# Patient Record
Sex: Male | Born: 1945 | Race: Black or African American | Hispanic: No | Marital: Single | Smoking: Current every day smoker
Health system: Southern US, Community
[De-identification: ages and names within clinical notes are randomized; demographics above are authoritative.]

## PROBLEM LIST (undated history)

## (undated) DIAGNOSIS — E119 Type 2 diabetes mellitus without complications: Secondary | ICD-10-CM

## (undated) DIAGNOSIS — R4586 Emotional lability: Secondary | ICD-10-CM

## (undated) DIAGNOSIS — I10 Essential (primary) hypertension: Secondary | ICD-10-CM

## (undated) DIAGNOSIS — K859 Acute pancreatitis without necrosis or infection, unspecified: Secondary | ICD-10-CM

---

## 2015-04-09 ENCOUNTER — Emergency Department: Payer: Non-veteran care

## 2015-04-09 ENCOUNTER — Encounter: Payer: Self-pay | Admitting: Emergency Medicine

## 2015-04-09 ENCOUNTER — Emergency Department
Admission: EM | Admit: 2015-04-09 | Discharge: 2015-04-10 | Disposition: A | Discharge status: Other Acute Inpatient Hospital | Payer: Non-veteran care | Attending: Emergency Medicine | Admitting: Emergency Medicine

## 2015-04-09 DIAGNOSIS — Z880 Allergy status to penicillin: Secondary | ICD-10-CM | POA: Insufficient documentation

## 2015-04-09 DIAGNOSIS — E119 Type 2 diabetes mellitus without complications: Secondary | ICD-10-CM | POA: Insufficient documentation

## 2015-04-09 DIAGNOSIS — K8500 Idiopathic acute pancreatitis without necrosis or infection: Secondary | ICD-10-CM

## 2015-04-09 DIAGNOSIS — R109 Unspecified abdominal pain: Secondary | ICD-10-CM

## 2015-04-09 DIAGNOSIS — Z720 Tobacco use: Secondary | ICD-10-CM | POA: Insufficient documentation

## 2015-04-09 DIAGNOSIS — I10 Essential (primary) hypertension: Secondary | ICD-10-CM | POA: Insufficient documentation

## 2015-04-09 HISTORY — DX: Type 2 diabetes mellitus without complications: E11.9

## 2015-04-09 HISTORY — DX: Acute pancreatitis without necrosis or infection, unspecified: K85.90

## 2015-04-09 HISTORY — DX: Emotional lability: R45.86

## 2015-04-09 HISTORY — DX: Essential (primary) hypertension: I10

## 2015-04-09 LAB — COMPREHENSIVE METABOLIC PANEL
ALBUMIN: 4 g/dL (ref 3.5–5.0)
ALK PHOS: 63 U/L (ref 38–126)
ALT: 84 U/L — AB (ref 17–63)
AST: 73 U/L — AB (ref 15–41)
Anion gap: 10 (ref 5–15)
BILIRUBIN TOTAL: 1.8 mg/dL — AB (ref 0.3–1.2)
BUN: 11 mg/dL (ref 6–20)
CALCIUM: 8.5 mg/dL — AB (ref 8.9–10.3)
CO2: 24 mmol/L (ref 22–32)
CREATININE: 1.11 mg/dL (ref 0.61–1.24)
Chloride: 100 mmol/L — ABNORMAL LOW (ref 101–111)
GFR calc Af Amer: >60 mL/min (ref >60)
GLUCOSE: 141 mg/dL — AB (ref 65–99)
POTASSIUM: 3.5 mmol/L (ref 3.5–5.1)
Sodium: 134 mmol/L — ABNORMAL LOW (ref 135–145)
TOTAL PROTEIN: 8.2 g/dL — AB (ref 6.5–8.1)

## 2015-04-09 LAB — CBC
HEMATOCRIT: 44.8 % (ref 40.0–52.0)
Hemoglobin: 15.2 g/dL (ref 13.0–18.0)
MCH: 30.1 pg (ref 26.0–34.0)
MCHC: 33.9 g/dL (ref 32.0–36.0)
MCV: 88.8 fL (ref 80.0–100.0)
PLATELETS: 181 10*3/uL (ref 150–440)
RBC: 5.04 MIL/uL (ref 4.40–5.90)
RDW: 15 % — AB (ref 11.5–14.5)
WBC: 17.2 10*3/uL — AB (ref 3.8–10.6)

## 2015-04-09 LAB — URINALYSIS COMPLETE WITH MICROSCOPIC (ARMC ONLY)
BILIRUBIN URINE: NEGATIVE
Bacteria, UA: NONE SEEN
GLUCOSE, UA: NEGATIVE mg/dL
Hgb urine dipstick: NEGATIVE
Ketones, ur: NEGATIVE mg/dL
Leukocytes, UA: NEGATIVE
NITRITE: NEGATIVE
Protein, ur: NEGATIVE mg/dL
SPECIFIC GRAVITY, URINE: 1.012 (ref 1.005–1.030)
Squamous Epithelial / LPF: NONE SEEN
pH: 7 (ref 5.0–8.0)

## 2015-04-09 LAB — LIPASE, BLOOD: Lipase: 21 U/L — ABNORMAL LOW (ref 22–51)

## 2015-04-09 LAB — TROPONIN I: Troponin I: <0.03 ng/mL (ref <0.031)

## 2015-04-09 MED ORDER — METOCLOPRAMIDE HCL 5 MG/ML IJ SOLN
5.0000 mg | Freq: Once | INTRAMUSCULAR | Status: AC
  Administered 2015-04-09: 5 mg via INTRAVENOUS

## 2015-04-09 MED ORDER — IOHEXOL 240 MG/ML SOLN
25.0000 mL | Freq: Once | INTRAMUSCULAR | Status: AC | PRN
  Administered 2015-04-09: 25 mL via ORAL

## 2015-04-09 MED ORDER — SODIUM CHLORIDE 0.9 % IV BOLUS (SEPSIS)
1000.0000 mL | Freq: Once | INTRAVENOUS | Status: AC
  Administered 2015-04-09: 1000 mL via INTRAVENOUS

## 2015-04-09 MED ORDER — IOHEXOL 300 MG/ML  SOLN
125.0000 mL | Freq: Once | INTRAMUSCULAR | Status: AC | PRN
  Administered 2015-04-09: 125 mL via INTRAVENOUS

## 2015-04-09 MED ORDER — ONDANSETRON HCL 4 MG/2ML IJ SOLN
4.0000 mg | Freq: Once | INTRAMUSCULAR | Status: AC
  Administered 2015-04-09: 4 mg via INTRAVENOUS

## 2015-04-09 MED ORDER — HYDROMORPHONE HCL 1 MG/ML IJ SOLN
0.5000 mg | INTRAMUSCULAR | Status: AC | PRN
  Administered 2015-04-09 (×2): 0.5 mg via INTRAVENOUS

## 2015-04-09 MED ORDER — ONDANSETRON HCL 4 MG/2ML IJ SOLN
INTRAMUSCULAR | Status: AC
  Administered 2015-04-09: 4 mg via INTRAVENOUS
  Filled 2015-04-09: qty 2

## 2015-04-09 NOTE — ED Notes (Signed)
Pt to triage via w/c, holding abdomen, appears uncomfortable; pt reports x 3 days having umbilical pain; st hx of pancreatits and pain feels same; st pain accomp by N/V

## 2015-04-09 NOTE — ED Provider Notes (Signed)
[facility] Emergency Department Alan Byrd Note  ____________________________________________  Time seen: 2105 I have reviewed the triage vital signs and the nursing notes.   HISTORY  Chief Complaint Abdominal Pain     HPI Alan Byrd is a 69 y.o. male who presents to the hospital with significant abdominal pain. He points towards the lower portion of his belly, just below his umbilicus, slightly more to the left but generally diffuse. He reports this is similar to when he had pancreatitis before, however he is not pointing to the epigastric area. When he had pancreatitis in spent 40 days in the [facility] hospital.  This pain began 3 days ago.   He denies nausea. He denies any diarrhea. His companion reports she thinks his belly is a little bit distended and firm or than usual.      Past Medical History  Diagnosis Date  . Pancreatitis   . Hypertension   . Mood swings   . Diabetes mellitus without complication     There are no active problems to display for this patient.   History reviewed. No pertinent past surgical history.  No current outpatient prescriptions on file.  Allergies Penicillins  No family history on file.  Social History Social History  Substance Use Topics  . Smoking status: Current Every Day Smoker -- 1.00 packs/day    Types: Cigarettes  . Smokeless tobacco: None  . Alcohol Use: Yes     Comment: st pint vodka and 3-4 40oz beer and whisky daily; no ETOH in 2 days    Review of Systems  Constitutional: Negative for fever. ENT: Negative for sore throat. Cardiovascular: Negative for chest pain. Respiratory: Negative for shortness of breath. Gastrointestinal: Positive for abdominal pain. See history of present illness history of pancreatitis. Genitourinary: Negative for dysuria. Musculoskeletal: No myalgias or injuries. Skin: Negative for rash. Neurological: Negative for headaches   10-point ROS  otherwise negative.  ____________________________________________   PHYSICAL EXAM:  VITAL SIGNS: ED Triage Vitals  Enc Vitals Group     BP 04/09/15 2033 128/110 mmHg     Pulse Rate 04/09/15 2033 48     Resp 04/09/15 2033 20     Temp 04/09/15 2033 97.9 F (36.6 C)     Temp Source 04/09/15 2033 Oral     SpO2 04/09/15 2033 99 %     Weight 04/09/15 2033 240 lb (108.863 kg)     Height 04/09/15 2033  (1.88 m)     Head Cir --      Peak Flow --      Pain Score 04/09/15 2034 10     Pain Loc --      Pain Edu? --      Excl. in GC? --     Constitutional:  Alert and oriented. Appears uncomfortable, holding hand over his belly, and appears in mild distress. ENT   Head: Normocephalic and atraumatic.   Nose: No congestion/rhinnorhea.   Mouth/Throat: Mucous membranes are moist. Cardiovascular: Tachycardic at a rate of 110, regular rhythm, no murmur noted Respiratory:  Normal respiratory effort, no tachypnea.    Breath sounds are clear and equal bilaterally.  Gastrointestinal: Mild distention, tender diffusely, but worse in the lower abdomen.  Back: No muscle spasm, no tenderness, no CVA tenderness. Musculoskeletal: No deformity noted. Nontender with normal range of motion in all extremities.  No noted edema. Neurologic:  Normal speech and language. No gross focal neurologic deficits are appreciated.  Skin:  Skin is warm, dry.  No rash noted. Psychiatric: Mood and affect are normal. Speech and behavior are normal.  ____________________________________________    LABS (pertinent positives/negatives)  Labs Reviewed  LIPASE, BLOOD - Abnormal; Notable for the following:    Lipase 21 (*)    All other components within normal limits  COMPREHENSIVE METABOLIC PANEL - Abnormal; Notable for the following:    Sodium 134 (*)    Chloride 100 (*)    Glucose, Bld 141 (*)    Calcium 8.5 (*)    Total Protein 8.2 (*)    AST 73 (*)    ALT 84 (*)    Total Bilirubin 1.8 (*)    All  other components within normal limits  CBC - Abnormal; Notable for the following:    WBC 17.2 (*)    RDW 15.0 (*)    All other components within normal limits  URINALYSIS COMPLETEWITH MICROSCOPIC ([facility] ONLY) - Abnormal; Notable for the following:    Color, Urine YELLOW (*)    APPearance CLEAR (*)    All other components within normal limits  TROPONIN I     ____________________________________________   EKG  ED ECG REPORT I, KAMINSKI,DAVID W, the attending physician, personally viewed and interpreted this ECG.   Date: 04/09/2015  EKG Time: 2040  Rate: 1:30  Rhythm: Tachycardia with premature atrial complexes, irregular rhythm  Axis: Left axis deviation   Intervals: QTC of 500  ST&T Change: None noted   ____________________________________________    RADIOLOGY  Acute abdomen: ----------------------------------------- 9:50 PM on 04/09/2015 ----------------------------------------- I have reviewed these images to help guide the patient's immediate and current care. There is no sign of a bowel perforation or free air. There is a minor air-fluid level or 2 consistent with a possible partial bowel obstruction. We will obtain a CT scan. Official interpretation pending.   CT of abdomen and pelvis: IMPRESSION: 1. Hazy inflammatory stranding about the pancreas, suggesting acute pancreatitis. No evidence for complication. 2. Peripancreatic adenopathy measuring up to 2.1 cm, likely reactive. 3. Mild colonic diverticulosis. 4. Hepatic steatosis.   ____________________________________________   PROCEDURES  CRITICAL CARE Performed by: Darien Ramus   Total critical care time: 40 minutes due to the worrisome initial presentation, complex differential diagnosis, review of an ordering of multiple imaging modalities, and discussed with other physicians.  Critical care time was exclusive of separately billable procedures and treating other patients.  Critical care was  necessary to treat or prevent imminent or life-threatening deterioration.  Critical care was time spent personally by me on the following activities: development of treatment plan with patient and/or surrogate as well as nursing, discussions with consultants, evaluation of patient's response to treatment, examination of patient, obtaining history from patient or surrogate, ordering and performing treatments and interventions, ordering and review of laboratory studies, ordering and review of radiographic studies, pulse oximetry and re-evaluation of patient's condition.   ____________________________________________   INITIAL IMPRESSION / ASSESSMENT AND PLAN / ED COURSE  Pertinent labs & imaging results that were available during my care of the patient were reviewed by me and considered in my medical decision making (see chart for details).  Ill-appearing 69 year old male with notable abdominal pain with some mild to moderate distention and firmness. This pattern is worrisome for a perforation. We will obtain a stat acute abdominal x-ray to assess for free air. If this is negative we will continue with a CT scan for this patient who is notably uncomfortable. Lab tests have been ordered and are pending. We will treat his pain  with IV Dilaudid and Zofran and give him 1 L of normal saline IV.  ----------------------------------------- 9:52 PM on 04/09/2015 -----------------------------------------  Plain abdominal x-ray without free air. We will obtain a CT scan to evaluate for bowel obstruction or other pathology.  ----------------------------------------- 11:02 PM on 04/09/2015 -----------------------------------------  CT scan shows hazy inflammatory stranding consistent with pancreatitis. No sign of bowel obstruction or perforation.  Reassessment of the patient at this time finds him more comfortable. I do think he should be kept in the hospital so that he can be kept on bowel rest and let  the pancreatitis resolved. He is a [place] patient and we will contact the [place] regarding transfer.  I will last Dr. Zenda Alpers to follow-up on the transfer request and seemed care of the patient at this time.  ____________________________________________   FINAL CLINICAL IMPRESSION(S) / ED DIAGNOSES  Final diagnoses:  Abdominal pain  Idiopathic acute pancreatitis      Darien Ramus, MD 04/09/15 2309

## 2015-04-10 LAB — LACTIC ACID, PLASMA: LACTIC ACID, VENOUS: 1.1 mmol/L (ref 0.5–2.0)

## 2015-04-10 MED ORDER — HYDROMORPHONE HCL 1 MG/ML IJ SOLN
INTRAMUSCULAR | Status: AC
  Administered 2015-04-10: 1 mg via INTRAVENOUS
  Filled 2015-04-10: qty 1

## 2015-04-10 MED ORDER — CIPROFLOXACIN IN D5W 400 MG/200ML IV SOLN
400.0000 mg | Freq: Once | INTRAVENOUS | Status: AC
  Administered 2015-04-10: 400 mg via INTRAVENOUS
  Filled 2015-04-10: qty 200

## 2015-04-10 MED ORDER — SODIUM CHLORIDE 0.9 % IV BOLUS (SEPSIS)
1000.0000 mL | Freq: Once | INTRAVENOUS | Status: AC
  Administered 2015-04-10: 1000 mL via INTRAVENOUS

## 2015-04-10 MED ORDER — METRONIDAZOLE IN NACL 5-0.79 MG/ML-% IV SOLN
500.0000 mg | Freq: Once | INTRAVENOUS | Status: AC
  Administered 2015-04-10: 500 mg via INTRAVENOUS
  Filled 2015-04-10: qty 100

## 2015-04-10 MED ORDER — HYDROMORPHONE HCL 1 MG/ML IJ SOLN
INTRAMUSCULAR | Status: AC
  Filled 2015-04-10: qty 1

## 2015-04-10 MED ORDER — HYDROMORPHONE HCL 1 MG/ML IJ SOLN
1.0000 mg | INTRAMUSCULAR | Status: DC | PRN
  Administered 2015-04-10: 1 mg via INTRAVENOUS

## 2015-04-10 NOTE — ED Provider Notes (Signed)
-----------------------------------------   2:26 AM on 04/10/2015 -----------------------------------------   Blood pressure 128/68, pulse 120, temperature 98.3 F (36.8 C), temperature source Oral, resp. rate 17, height  (1.88 m), weight 240 lb (108.863 kg), SpO2 94 %.  Assuming care from Dr. Carollee Massed.  In short, Alan Byrd is a 69 y.o. male with a chief complaint of Abdominal Pain .  Refer to the original H&P for additional details.  The current plan of care is to transfer to the [place].  The patient was signed out by Dr. Carollee Massed but he developed some tachycardia. He had an EKG ordered and NS 2 liters, dilaudid and antibiotics. It was discovered that the patient is allergic to PCN so his antibiotics were changed to ciprofloxacin and flagyl. After almost 2 L of NS the patient's tachycardia improved and his pain also improved. [facility]. He will be transferred. The patient's heart rate had improved to the 105-110 range prior to transfer.   ED ECG REPORT I, Rebecka Apley, the attending physician, personally viewed and interpreted this ECG.   Date: 04/10/2015  EKG Time: 0008  Rate: 141  Rhythm: sinus tachycardia, 1st degree AV block  Axis: normal  Intervals:first-degree A-V block   ST&T Change: none    Rebecka Apley, MD 04/10/15 0231

## 2015-04-15 LAB — CULTURE, BLOOD (ROUTINE X 2)
Culture: NO GROWTH
Culture: NO GROWTH
SPECIAL REQUESTS: NORMAL
Special Requests: NORMAL

## 2015-10-11 ENCOUNTER — Encounter: Payer: Self-pay | Admitting: Emergency Medicine

## 2015-10-11 ENCOUNTER — Emergency Department
Admission: EM | Admit: 2015-10-11 | Discharge: 2015-10-12 | Disposition: A | Discharge status: Other Acute Inpatient Hospital | Payer: Non-veteran care | Attending: Emergency Medicine | Admitting: Emergency Medicine

## 2015-10-11 DIAGNOSIS — F1420 Cocaine dependence, uncomplicated: Secondary | ICD-10-CM | POA: Diagnosis present

## 2015-10-11 DIAGNOSIS — F4310 Post-traumatic stress disorder, unspecified: Secondary | ICD-10-CM | POA: Diagnosis present

## 2015-10-11 DIAGNOSIS — F1410 Cocaine abuse, uncomplicated: Secondary | ICD-10-CM | POA: Insufficient documentation

## 2015-10-11 DIAGNOSIS — I10 Essential (primary) hypertension: Secondary | ICD-10-CM | POA: Insufficient documentation

## 2015-10-11 DIAGNOSIS — Z880 Allergy status to penicillin: Secondary | ICD-10-CM | POA: Insufficient documentation

## 2015-10-11 DIAGNOSIS — E119 Type 2 diabetes mellitus without complications: Secondary | ICD-10-CM

## 2015-10-11 DIAGNOSIS — Z79899 Other long term (current) drug therapy: Secondary | ICD-10-CM | POA: Insufficient documentation

## 2015-10-11 DIAGNOSIS — F1020 Alcohol dependence, uncomplicated: Secondary | ICD-10-CM | POA: Diagnosis present

## 2015-10-11 DIAGNOSIS — F1994 Other psychoactive substance use, unspecified with psychoactive substance-induced mood disorder: Secondary | ICD-10-CM | POA: Diagnosis present

## 2015-10-11 DIAGNOSIS — F329 Major depressive disorder, single episode, unspecified: Secondary | ICD-10-CM

## 2015-10-11 DIAGNOSIS — F32A Depression, unspecified: Secondary | ICD-10-CM

## 2015-10-11 DIAGNOSIS — F17210 Nicotine dependence, cigarettes, uncomplicated: Secondary | ICD-10-CM | POA: Insufficient documentation

## 2015-10-11 DIAGNOSIS — R45850 Homicidal ideations: Secondary | ICD-10-CM

## 2015-10-11 DIAGNOSIS — F10129 Alcohol abuse with intoxication, unspecified: Secondary | ICD-10-CM | POA: Insufficient documentation

## 2015-10-11 DIAGNOSIS — R45851 Suicidal ideations: Secondary | ICD-10-CM

## 2015-10-11 LAB — CBC
HEMATOCRIT: 46 % (ref 40.0–52.0)
HEMOGLOBIN: 15.3 g/dL (ref 13.0–18.0)
MCH: 31 pg (ref 26.0–34.0)
MCHC: 33.3 g/dL (ref 32.0–36.0)
MCV: 93.2 fL (ref 80.0–100.0)
Platelets: 200 10*3/uL (ref 150–440)
RBC: 4.94 MIL/uL (ref 4.40–5.90)
RDW: 14.3 % (ref 11.5–14.5)
WBC: 11.5 10*3/uL — AB (ref 3.8–10.6)

## 2015-10-11 LAB — COMPREHENSIVE METABOLIC PANEL
ALBUMIN: 4.1 g/dL (ref 3.5–5.0)
ALK PHOS: 84 U/L (ref 38–126)
ALT: 189 U/L — ABNORMAL HIGH (ref 17–63)
AST: 181 U/L — AB (ref 15–41)
Anion gap: 7 (ref 5–15)
BILIRUBIN TOTAL: 0.4 mg/dL (ref 0.3–1.2)
BUN: 7 mg/dL (ref 6–20)
CALCIUM: 8.5 mg/dL — AB (ref 8.9–10.3)
CO2: 23 mmol/L (ref 22–32)
Chloride: 108 mmol/L (ref 101–111)
Creatinine, Ser: 1.07 mg/dL (ref 0.61–1.24)
GFR calc Af Amer: >60 mL/min (ref >60)
GFR calc non Af Amer: >60 mL/min (ref >60)
GLUCOSE: 270 mg/dL — AB (ref 65–99)
Potassium: 3.7 mmol/L (ref 3.5–5.1)
Sodium: 138 mmol/L (ref 135–145)
TOTAL PROTEIN: 8.7 g/dL — AB (ref 6.5–8.1)

## 2015-10-11 LAB — URINE DRUG SCREEN, QUALITATIVE (ARMC ONLY)
AMPHETAMINES, UR SCREEN: NOT DETECTED
BENZODIAZEPINE, UR SCRN: NOT DETECTED
Barbiturates, Ur Screen: NOT DETECTED
COCAINE METABOLITE, UR ~~LOC~~: POSITIVE — AB
Cannabinoid 50 Ng, Ur ~~LOC~~: NOT DETECTED
MDMA (Ecstasy)Ur Screen: NOT DETECTED
Methadone Scn, Ur: NOT DETECTED
OPIATE, UR SCREEN: NOT DETECTED
PHENCYCLIDINE (PCP) UR S: NOT DETECTED
Tricyclic, Ur Screen: NOT DETECTED

## 2015-10-11 LAB — ETHANOL: Alcohol, Ethyl (B): 236 mg/dL — ABNORMAL HIGH (ref <5)

## 2015-10-11 LAB — SALICYLATE LEVEL: Salicylate Lvl: <4 mg/dL (ref 2.8–30.0)

## 2015-10-11 LAB — GLUCOSE, CAPILLARY: Glucose-Capillary: 188 mg/dL — ABNORMAL HIGH (ref 65–99)

## 2015-10-11 LAB — ACETAMINOPHEN LEVEL: Acetaminophen (Tylenol), Serum: <10 ug/mL — ABNORMAL LOW (ref 10–30)

## 2015-10-11 MED ORDER — TRAZODONE HCL 50 MG PO TABS
50.0000 mg | ORAL_TABLET | Freq: Every day | ORAL | Status: DC
  Administered 2015-10-11: 50 mg via ORAL
  Filled 2015-10-11: qty 1

## 2015-10-11 MED ORDER — CITALOPRAM HYDROBROMIDE 20 MG PO TABS
20.0000 mg | ORAL_TABLET | Freq: Every day | ORAL | Status: DC
  Administered 2015-10-11 – 2015-10-12 (×2): 20 mg via ORAL
  Filled 2015-10-11 (×2): qty 1

## 2015-10-11 MED ORDER — METFORMIN HCL 500 MG PO TABS
500.0000 mg | ORAL_TABLET | Freq: Two times a day (BID) | ORAL | Status: DC
  Administered 2015-10-11 – 2015-10-12 (×2): 500 mg via ORAL
  Filled 2015-10-11 (×2): qty 1

## 2015-10-11 MED ORDER — HYDROCHLOROTHIAZIDE 12.5 MG PO CAPS
12.5000 mg | ORAL_CAPSULE | Freq: Every day | ORAL | Status: DC
  Administered 2015-10-11 – 2015-10-12 (×2): 12.5 mg via ORAL
  Filled 2015-10-11 (×2): qty 1

## 2015-10-11 NOTE — ED Notes (Signed)
Patient given breakfast tray. Patient resting comfortably at this time.

## 2015-10-11 NOTE — ED Notes (Signed)
Patient ambulatory to triage with steady gait, without difficulty or distress noted; [place] [place] [facility] st SI, hx PTSD, ETOH

## 2015-10-11 NOTE — ED Notes (Signed)

## 2015-10-11 NOTE — ED Provider Notes (Signed)
[facility] Emergency Department Provider Note  ____________________________________________  Time seen: Approximately 1:44 AM  I have reviewed the triage vital signs and the nursing notes.   HISTORY  Chief Complaint Mental Health Problem    HPI Alan Byrd is a 70 y.o. male brought to the ED from home by [facility] police officers with a chief complaint of depression with suicidal ideation. Patient has a history of PTSD, states he has "women troubles". Arrives intoxicated complaining of depression and voicing suicidal thoughts without plan. Voices no medical complaints. Specifically, denies fever, chills, chest pain, shortness of breath, abdominal pain, nausea, vomiting, diarrhea. Denies homicidal ideation, AH or VH.  Past Medical History  Diagnosis Date  . Pancreatitis   . Hypertension   . Mood swings (HCC)   . Diabetes mellitus without complication (HCC)     There are no active problems to display for this patient.   History reviewed. No pertinent past surgical history.  Current Outpatient Rx  Name  Route  Sig  Dispense  Refill  . lisinopril (PRINIVIL,ZESTRIL) 5 MG tablet   Oral   Take 5 mg by mouth daily.         . mirtazapine (REMERON) 30 MG tablet   Oral   Take 30 mg by mouth at bedtime.         Marland Kitchen oxyCODONE-acetaminophen (PERCOCET/ROXICET) 5-325 MG tablet   Oral   Take 1 tablet by mouth every 4 (four) hours as needed for severe pain.         . traZODone (DESYREL) 50 MG tablet   Oral   Take 50 mg by mouth at bedtime.           Allergies Penicillins  No family history on file.  Social History Social History  Substance Use Topics  . Smoking status: Current Every Day Smoker -- 1.00 packs/day    Types: Cigarettes  . Smokeless tobacco: None  . Alcohol Use: Yes     Comment: st pint vodka and 3-4 40oz beer and whisky daily; no ETOH in 2 days    Review of Systems  Constitutional: No fever/chills. Eyes: No visual  changes. ENT: No sore throat. Cardiovascular: Denies chest pain. Respiratory: Denies shortness of breath. Gastrointestinal: No abdominal pain.  No nausea, no vomiting.  No diarrhea.  No constipation. Genitourinary: Negative for dysuria. Musculoskeletal: Negative for back pain. Skin: Negative for rash. Neurological: Negative for headaches, focal weakness or numbness. Psychiatric: Positive for depression with SI.  10-point ROS otherwise negative.  ____________________________________________   PHYSICAL EXAM:  VITAL SIGNS: ED Triage Vitals  Enc Vitals Group     BP 10/11/15 0027 158/89 mmHg     Pulse Rate 10/11/15 0027 120     Resp 10/11/15 0027 18     Temp 10/11/15 0027 97.6 F (36.4 C)     Temp Source 10/11/15 0027 Oral     SpO2 10/11/15 0027 98 %     Weight 10/11/15 0027 225 lb (102.059 kg)     Height 10/11/15 0027  (1.88 m)     Head Cir --      Peak Flow --      Pain Score --      Pain Loc --      Pain Edu? --      Excl. in GC? --     Constitutional: Alert and oriented. Well appearing and in no acute distress. Intoxicated. Eyes: Conjunctivae are normal. PERRL. EOMI. Head: Atraumatic. Nose: No congestion/rhinnorhea. Mouth/Throat: Mucous membranes  are moist.  Oropharynx non-erythematous. Neck: No stridor.   Cardiovascular: Normal rate, regular rhythm. Grossly normal heart sounds.  Good peripheral circulation. Respiratory: Normal respiratory effort.  No retractions. Lungs CTAB. Gastrointestinal: Soft and nontender. No distention. No abdominal bruits. No CVA tenderness. Musculoskeletal: No lower extremity tenderness nor edema.  No joint effusions. Neurologic:  Normal speech and language. No gross focal neurologic deficits are appreciated. No gait instability. Skin:  Skin is warm, dry and intact. No rash noted. Psychiatric: Mood and affect are normal. Speech and behavior are normal.  ____________________________________________   LABS (all labs ordered are  listed, but only abnormal results are displayed)  Labs Reviewed  COMPREHENSIVE METABOLIC PANEL - Abnormal; Notable for the following:    Glucose, Bld 270 (*)    Calcium 8.5 (*)    Total Protein 8.7 (*)    AST 181 (*)    ALT 189 (*)    All other components within normal limits  ETHANOL - Abnormal; Notable for the following:    Alcohol, Ethyl (B) 236 (*)    All other components within normal limits  ACETAMINOPHEN LEVEL - Abnormal; Notable for the following:    Acetaminophen (Tylenol), Serum <10 (*)    All other components within normal limits  CBC - Abnormal; Notable for the following:    WBC 11.5 (*)    All other components within normal limits  URINE DRUG SCREEN, QUALITATIVE ([facility] ONLY) - Abnormal; Notable for the following:    Cocaine Metabolite,Ur  POSITIVE (*)    All other components within normal limits  SALICYLATE LEVEL   ____________________________________________  EKG  None ____________________________________________  RADIOLOGY  None ____________________________________________   PROCEDURES  Procedure(s) performed: None  Critical Care performed: No  ____________________________________________   INITIAL IMPRESSION / ASSESSMENT AND PLAN / ED COURSE  Pertinent labs & imaging results that were available during my care of the patient were reviewed by me and considered in my medical decision making (see chart for details).  70 year old male with a history of PTSD [place] [facility]. Laboratory and urinalysis results remarkable for mildly elevated transaminases along with cocaine-positive metabolites and urine. Patient has no abdominal or chest pain on my exam. He is medically cleared at this time for TTS and psychiatry evaluations. Contracts for safety in the emergency department; will maintain voluntary status.  ----------------------------------------- 7:13 AM on  10/11/2015 -----------------------------------------  Patient resting in no acute distress. [facility]. ____________________________________________   FINAL CLINICAL IMPRESSION(S) / ED DIAGNOSES  Final diagnoses:  Alcohol abuse with intoxication (HCC)  Cocaine abuse  Depression      Irean HongJade J Zakkery Dorian, MD 10/11/15 [id number]
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
## 2015-10-11 NOTE — ED Notes (Addendum)
Patient assigned to appropriate care area. Patient oriented to unit/care area: Informed that, for their safety, care areas are designed for safety and monitored by security cameras at all times; and visiting hours explained to patient. Patient verbalizes understanding, and verbal contract for safety obtained.  Pt denies pain and SI. Positive for HI. "Not anyone here." Pt stated he does not feel safe going home at this time. Pt cooperative, calm. No concerns voiced. No distress noted. Maintained on 15 minute checks and observation by security camera for safety.

## 2015-10-11 NOTE — ED Notes (Signed)
Patient resting quietly in room. No noted distress or abnormal behaviors noted. Will continue 15 minute checks and observation by security camera for safety. 

## 2015-10-11 NOTE — ED Notes (Signed)
Patient resting with eyes closed. Even respirations noted. 

## 2015-10-11 NOTE — ED Notes (Signed)
Sandwich and soft drink given.  

## 2015-10-11 NOTE — Progress Notes (Addendum)
Inpatient Diabetes Program Recommendations  AACE/ADA: New Consensus Statement on Inpatient Glycemic Control (2015)  Target Ranges:  Prepandial:   less than 140 mg/dL      Peak postprandial:   less than 180 mg/dL (1-2 hours)      Critically ill patients:  140 - 180 mg/dL    Inpatient Diabetes Program Recommendations: Patient has a documented history of diabetes- consider checking an A1C and blood sugars tid and hs.  Consider ordering a carb modified/ heart healthy diet.  Susette RacerJulie Kaaren Nass, RN, BA, MHA, CDE Diabetes Coordinator Inpatient Diabetes Program  [phone] (Team Pager) [phone] [facility] Office) 10/11/2015 12:57 PM

## 2015-10-11 NOTE — ED Notes (Signed)
Pt resting in bed, watching TV. Pt remains calm. No concerns voiced. No distress noted. Maintained on 15 minute checks and observation by security camera for safety.

## 2015-10-11 NOTE — ED Provider Notes (Signed)
[facility] Emergency Department Provider Note  ____________________________________________  Time seen: Approximately 11:07 AM  I have reviewed the triage vital signs and the nursing notes.   HISTORY  Chief Complaint Mental Health Problem  HPI Alan Byrd is a 70 y.o. male who is complaining that he's had progressive worsening depression just secondary to life in general. Patient does not elaborate any further as far as the history of why he is so depressed. The patient states that he's had a plan to take pills to overdose. Patient denies any other recent illnesses, or major medical problems. Patient also denies any homicidal ideations. Patient denies any hallucinations, delusions, hearing voices, nausea, vomiting, diarrhea, urinary or respiratory symptoms. Patient denies any pain anywhere. Patient does have history of alcohol and cocaine abuse as well.   Past Medical History  Diagnosis Date  . Pancreatitis   . Hypertension   . Mood swings (HCC)   . Diabetes mellitus without complication [facility])     Patient Active Problem List   Diagnosis Date Noted  . Substance induced mood disorder (HCC) 10/11/2015  . Homicidal ideation 10/11/2015  . Suicidal ideation 10/11/2015  . Alcohol abuse 10/11/2015  . Cocaine abuse 10/11/2015  . PTSD (post-traumatic stress disorder) 10/11/2015  . Hypertension 10/11/2015  . Diabetes mellitus without complication (HCC) 10/11/2015    History reviewed. No pertinent past surgical history.  Current Outpatient Rx  Name  Route  Sig  Dispense  Refill  . lisinopril (PRINIVIL,ZESTRIL) 5 MG tablet   Oral   Take 5 mg by mouth daily.         . mirtazapine (REMERON) 30 MG tablet   Oral   Take 30 mg by mouth at bedtime.         Marland Kitchen oxyCODONE-acetaminophen (PERCOCET/ROXICET) 5-325 MG tablet   Oral   Take 1 tablet by mouth every 4 (four) hours as needed for severe pain.         . traZODone (DESYREL) 50 MG tablet   Oral    Take 50 mg by mouth at bedtime.           Allergies Penicillins  No family history on file.  Social History Social History  Substance Use Topics  . Smoking status: Current Every Day Smoker -- 1.00 packs/day    Types: Cigarettes  . Smokeless tobacco: None  . Alcohol Use: Yes     Comment: st pint vodka and 3-4 40oz beer and whisky daily; no ETOH in 2 days    Review of Systems Constitutional: No fever/chills Eyes: No visual changes. ENT: No sore throat. Cardiovascular: Denies chest pain. Respiratory: Denies shortness of breath. Gastrointestinal: No abdominal pain.  No nausea, no vomiting.  No diarrhea.  No constipation. Genitourinary: Negative for dysuria. Musculoskeletal: Negative for back pain. Skin: Negative for rash. Neurological: Negative for headaches, focal weakness or numbness. Psychological:  Depression and suicidal ideations with suicidal plan to take a bunch of pills.   10-point ROS otherwise negative.  ____________________________________________   PHYSICAL EXAM:  VITAL SIGNS: ED Triage Vitals  Enc Vitals Group     BP 10/11/15 0027 158/89 mmHg     Pulse Rate 10/11/15 0027 120     Resp 10/11/15 0027 18     Temp 10/11/15 0027 97.6 F (36.4 C)     Temp Source 10/11/15 0027 Oral     SpO2 10/11/15 0027 98 %     Weight 10/11/15 0027 225 lb (102.059 kg)     Height 10/11/15 0027 6'  2" (1.88 m)     Head Cir --      Peak Flow --      Pain Score --      Pain Loc --      Pain Edu? --      Excl. in GC? --     Constitutional: Alert and oriented. Well appearing and in no acute distress. Eyes: Conjunctivae are normal. PERRL. EOMI. Head: Atraumatic. Nose: No congestion/rhinnorhea. Mouth/Throat: Mucous membranes are moist.  Oropharynx non-erythematous. Neck: No stridor.   Cardiovascular: Normal rate, regular rhythm. Grossly normal heart sounds.  Good peripheral circulation. Respiratory: Normal respiratory effort.  No retractions. Lungs  CTAB. Gastrointestinal: Soft and nontender. No distention. No abdominal bruits. No CVA tenderness. Musculoskeletal: No lower extremity tenderness nor edema.  No joint effusions. Neurologic:  Normal speech and language. No gross focal neurologic deficits are appreciated. No gait instability. Skin:  Skin is warm, dry and intact. No rash noted. Psychiatric: Mood and affect are depressed. Speech and behavior are normal.Pt is still SI, but no HI  ____________________________________________   LABS (all labs ordered are listed, but only abnormal results are displayed)  Labs Reviewed  COMPREHENSIVE METABOLIC PANEL - Abnormal; Notable for the following:    Glucose, Bld 270 (*)    Calcium 8.5 (*)    Total Protein 8.7 (*)    AST 181 (*)    ALT 189 (*)    All other components within normal limits  ETHANOL - Abnormal; Notable for the following:    Alcohol, Ethyl (B) 236 (*)    All other components within normal limits  ACETAMINOPHEN LEVEL - Abnormal; Notable for the following:    Acetaminophen (Tylenol), Serum <10 (*)    All other components within normal limits  CBC - Abnormal; Notable for the following:    WBC 11.5 (*)    All other components within normal limits  URINE DRUG SCREEN, QUALITATIVE ([facility] ONLY) - Abnormal; Notable for the following:    Cocaine Metabolite,Ur [place] POSITIVE (*)    All other components within normal limits  GLUCOSE, CAPILLARY - Abnormal; Notable for the following:    Glucose-Capillary 188 (*)    All other components within normal limits  SALICYLATE LEVEL   ____________________________________________  EKG  none ____________________________________________  RADIOLOGY  none ____________________________________________   PROCEDURES  Procedure(s) performed: None  Critical Care performed: No  ____________________________________________   INITIAL IMPRESSION / ASSESSMENT AND PLAN / ED COURSE  Pertinent labs & imaging results that were available  during my care of the patient were reviewed by me and considered in my medical decision making (see chart for details).  Patient will be evaluated as a TTS her behavior health for further treatment and evaluation at this time. ____________________________________________   FINAL CLINICAL IMPRESSION(S) / ED DIAGNOSES  Final diagnoses:  Alcohol abuse with intoxication (HCC)  Cocaine abuse  Depression      Leona CarryLinda M Aahil Fredin, MD 10/11/15 [phone]

## 2015-10-11 NOTE — ED Notes (Signed)
Dr clapacs in with pt  

## 2015-10-11 NOTE — ED Notes (Signed)
Pt requested a 2nd blanket. Compliant with medications. No further concerns voiced. No distress noted. Maintained on 15 minute checks and observation by security camera for safety.

## 2015-10-11 NOTE — ED Notes (Signed)
Pt. Alert and oriented, warm and dry, in no distress. Pt. Denies SI, HI, AVH, and any withdraw symptoms at time of assessement. Pt. Encouraged to let nursing staff know of any concerns or needs.

## 2015-10-11 NOTE — BH Assessment (Signed)
Assessment Note  Alan Byrd is an 70 y.o. male presenting to the ED via police after getting into an altercation over drugs.  Pt states he had the police bring him to the ED instead of jail.  Pt reports getting an argument with his girlfriend and some other men over money they owed him for drugs.  He states that he wanted the police to bring him to the Ed instead of taking him to jail.  He reports making statements about "killing a motherfucker" but did not identify who he was talking about.  Pt denies SI.  His BAC was 236 and tested positive for cocaine.  Diagnosis: Alcohol intoxication  Past Medical History:  Past Medical History  Diagnosis Date  . Pancreatitis   . Hypertension   . Mood swings (HCC)   . Diabetes mellitus without complication (HCC)     History reviewed. No pertinent past surgical history.  Family History: No family history on file.  Social History:  reports that he has been smoking Cigarettes.  He has been smoking about 1.00 pack per day. He does not have any smokeless tobacco history on file. He reports that he drinks alcohol. He reports that he does not use illicit drugs.  Additional Social History:  Alcohol / Drug Use History of alcohol / drug use?: Yes Substance #1 Name of Substance 1: ETOH 1 - Age of First Use: doesn't remember 1 - Amount (size/oz): 1/5 liquor 1 - Frequency: daily 1 - Duration: all day 1 - Last Use / Amount: 10/10/2015 Substance #2 Name of Substance 2: Cocaine 2 - Age of First Use: doesn't remember 2 - Amount (size/oz): gram 2 - Frequency: "as often as I cn get it" 2 - Duration: "as often as I can get it" 2 - Last Use / Amount: 10/11/2015  CIWA: CIWA-Ar BP: (!) 158/89 mmHg Pulse Rate: (!) 120 COWS:    Allergies:  Allergies  Allergen Reactions  . Penicillins Rash    Home Medications:  (Not in a hospital admission)  OB/GYN Status:  No LMP for male patient.  General Assessment Data [facility]  Assessment: In system Is this a Tele or Face-to-Face Assessment?: Face-to-Face Is this an Initial Assessment or a Re-assessment for this encounter?: Initial Assessment Marital status: Single Maiden name: N/A Is patient pregnant?: No Pregnancy Status: No Living Arrangements: Other relatives Can pt return to current living arrangement?: Yes Admission Status: Voluntary Is patient capable of signing voluntary admission?: Yes Referral Source: Self/Family/Friend Insurance type: [employer]  Medical Screening Exam [facility] Walk-in ONLY) Medical Exam completed: Yes  Crisis Care Plan Living Arrangements: Other relatives Legal Guardian: Other: (Self) [facility]  Education Status Is patient currently in school?: No Current Grade: N/A Highest grade of school patient has completed: N/A Name of school: N/A Contact person: N/A  Risk to self with the past 6 months Suicidal Ideation: No Has patient been a risk to self within the past 6 months prior to admission? : No Suicidal Intent: No Has patient had any suicidal intent within the past 6 months prior to admission? : No Is patient at risk for suicide?: No Suicidal Plan?: No Has patient had any suicidal plan within the past 6 months prior to admission? : No Access to Means: No What has been your use of drugs/alcohol within the last 12 months?: ETOH, cocaine Previous Attempts/Gestures: No How many times?: 0 Other Self Harm Risks: None identified Triggers for Past  Attempts: None known Intentional Self Injurious Behavior: None Family Suicide History: No Recent stressful life event(s): Conflict (Comment) (Conflict with girlfriend, family, others) Persecutory voices/beliefs?: No Depression: No Substance abuse history and/or treatment for substance abuse?: Yes Suicide prevention information given to non-admitted patients: Not applicable  Risk to Others within the past 6 months Homicidal  Ideation: Yes-Currently Present Does patient have any lifetime risk of violence toward others beyond the six months prior to admission? : No Thoughts of Harm to Others: No Current Homicidal Intent: No Current Homicidal Plan: No Access to Homicidal Means: No Identified Victim: None identified History of harm to others?: No Assessment of Violence: None Noted Violent Behavior Description: None identified Does patient have access to weapons?: No Criminal Charges Pending?: No Does patient have a court date: No Is patient on probation?: No  Psychosis Hallucinations: None noted Delusions: None noted  Mental Status Report Appearance/Hygiene: In scrubs Eye Contact: Fair Motor Activity: Freedom of movement Speech: Slurred Level of Consciousness: Drowsy Mood: Angry Affect: Appropriate to circumstance Anxiety Level: None Thought Processes: Relevant Judgement: Partial Orientation: Place, Person, Time, Situation Obsessive Compulsive Thoughts/Behaviors: None  Cognitive Functioning Concentration: Good Memory: Recent Intact, Remote Intact IQ: Average Insight: Fair Impulse Control: Fair Appetite: Good Weight Loss: 0 Weight Gain: 0 Sleep: No Change Total Hours of Sleep: 6 Vegetative Symptoms: None  ADLScreening [facility] Assessment Services) Patient's cognitive ability adequate to safely complete daily activities?: Yes Patient able to express need for assistance with ADLs?: Yes Independently performs ADLs?: Yes (appropriate for developmental age)  Prior Inpatient Therapy Prior Inpatient Therapy: No Prior Therapy Dates: N/A Prior Therapy Facilty/Provider(s): N/A Reason for Treatment: N/A  Prior Outpatient Therapy Prior Outpatient Therapy: No Prior Therapy Dates: N/A Prior Therapy Facilty/Provider(s): N/A Reason for Treatment: N/a Does patient have an ACCT team?: No Does patient have Intensive In-House Services?  : No Does patient have [facility] services? : No Does patient have  P4CC services?: No  ADL Screening (condition at time of admission) Patient's cognitive ability adequate to safely complete daily activities?: Yes Patient able to express need for assistance with ADLs?: Yes Independently performs ADLs?: Yes (appropriate for developmental age)       Abuse/Neglect Assessment (Assessment to be complete while patient is alone) Physical Abuse: Denies Verbal Abuse: Denies Sexual Abuse: Denies Exploitation of patient/patient's resources: Denies Self-Neglect: Denies Values / Beliefs Cultural Requests During Hospitalization: None Spiritual Requests During Hospitalization: None Consults Spiritual Care Consult Needed: No Social Work Consult Needed: No [place] navy officer (For Healthcare) Does patient have an advance directive?: No Would patient like information on creating an advanced directive?: No - patient declined information    Additional Information 1:1 In Past 12 Months?: No CIRT Risk: No Elopement Risk: No Does patient have medical clearance?: Yes     Disposition:  Disposition Initial Assessment Completed for this Encounter: Yes Disposition of Patient: Other dispositions Other disposition(s): Other (Comment) (Psych MD consult)  On Site Evaluation by:   Reviewed with Physician:    [place] 10/11/2015 2:19 AM

## 2015-10-12 ENCOUNTER — Inpatient Hospital Stay
Admission: RE | Admit: 2015-10-12 | Discharge: 2015-10-17 | DRG: 885 | Disposition: A | Discharge status: Home / Self Care | Payer: Non-veteran care | Source: Intra-hospital | Attending: Psychiatry | Admitting: Psychiatry

## 2015-10-12 ENCOUNTER — Encounter: Payer: Self-pay | Admitting: Psychiatry

## 2015-10-12 DIAGNOSIS — R45851 Suicidal ideations: Secondary | ICD-10-CM

## 2015-10-12 DIAGNOSIS — Z880 Allergy status to penicillin: Secondary | ICD-10-CM

## 2015-10-12 DIAGNOSIS — F1420 Cocaine dependence, uncomplicated: Secondary | ICD-10-CM | POA: Diagnosis present

## 2015-10-12 DIAGNOSIS — F333 Major depressive disorder, recurrent, severe with psychotic symptoms: Secondary | ICD-10-CM | POA: Diagnosis present

## 2015-10-12 DIAGNOSIS — F323 Major depressive disorder, single episode, severe with psychotic features: Principal | ICD-10-CM | POA: Diagnosis present

## 2015-10-12 DIAGNOSIS — I10 Essential (primary) hypertension: Secondary | ICD-10-CM | POA: Diagnosis present

## 2015-10-12 DIAGNOSIS — Z23 Encounter for immunization: Secondary | ICD-10-CM

## 2015-10-12 DIAGNOSIS — F1020 Alcohol dependence, uncomplicated: Secondary | ICD-10-CM | POA: Diagnosis present

## 2015-10-12 DIAGNOSIS — G4700 Insomnia, unspecified: Secondary | ICD-10-CM | POA: Diagnosis present

## 2015-10-12 DIAGNOSIS — F4310 Post-traumatic stress disorder, unspecified: Secondary | ICD-10-CM | POA: Diagnosis present

## 2015-10-12 DIAGNOSIS — R45850 Homicidal ideations: Secondary | ICD-10-CM

## 2015-10-12 DIAGNOSIS — F17200 Nicotine dependence, unspecified, uncomplicated: Secondary | ICD-10-CM | POA: Diagnosis present

## 2015-10-12 DIAGNOSIS — E119 Type 2 diabetes mellitus without complications: Secondary | ICD-10-CM

## 2015-10-12 DIAGNOSIS — F17210 Nicotine dependence, cigarettes, uncomplicated: Secondary | ICD-10-CM | POA: Diagnosis present

## 2015-10-12 LAB — GLUCOSE, CAPILLARY: GLUCOSE-CAPILLARY: 179 mg/dL — AB (ref 65–99)

## 2015-10-12 MED ORDER — METFORMIN HCL 500 MG PO TABS
500.0000 mg | ORAL_TABLET | Freq: Two times a day (BID) | ORAL | Status: DC
  Administered 2015-10-12 – 2015-10-15 (×6): 500 mg via ORAL
  Filled 2015-10-12 (×7): qty 1

## 2015-10-12 MED ORDER — ACETAMINOPHEN 325 MG PO TABS
650.0000 mg | ORAL_TABLET | Freq: Four times a day (QID) | ORAL | Status: DC | PRN
Start: 2015-10-12 — End: 2015-10-17

## 2015-10-12 MED ORDER — CHLORDIAZEPOXIDE HCL 25 MG PO CAPS
25.0000 mg | ORAL_CAPSULE | Freq: Four times a day (QID) | ORAL | Status: AC
  Administered 2015-10-12 – 2015-10-15 (×12): 25 mg via ORAL
  Filled 2015-10-12 (×12): qty 1

## 2015-10-12 MED ORDER — TRAZODONE HCL 100 MG PO TABS
100.0000 mg | ORAL_TABLET | Freq: Every day | ORAL | Status: DC
  Administered 2015-10-12 – 2015-10-16 (×5): 100 mg via ORAL
  Filled 2015-10-12 (×5): qty 1

## 2015-10-12 MED ORDER — CITALOPRAM HYDROBROMIDE 20 MG PO TABS: 20.0000 mg | ORAL_TABLET | Freq: Every day | ORAL | Status: DC

## 2015-10-12 MED ORDER — NICOTINE 21 MG/24HR TD PT24
21.0000 mg | MEDICATED_PATCH | Freq: Every day | TRANSDERMAL | Status: DC
  Administered 2015-10-12 – 2015-10-17 (×6): 21 mg via TRANSDERMAL
  Filled 2015-10-12 (×6): qty 1

## 2015-10-12 MED ORDER — MIRTAZAPINE 15 MG PO TABS
15.0000 mg | ORAL_TABLET | Freq: Every day | ORAL | Status: DC
  Administered 2015-10-12 – 2015-10-16 (×5): 15 mg via ORAL
  Filled 2015-10-12 (×6): qty 1

## 2015-10-12 MED ORDER — ALUM & MAG HYDROXIDE-SIMETH 200-200-20 MG/5ML PO SUSP: 30.0000 mL | ORAL | Status: DC | PRN

## 2015-10-12 MED ORDER — HYDROCHLOROTHIAZIDE 12.5 MG PO CAPS
12.5000 mg | ORAL_CAPSULE | Freq: Every day | ORAL | Status: DC
  Administered 2015-10-13 – 2015-10-17 (×5): 12.5 mg via ORAL
  Filled 2015-10-12 (×6): qty 1

## 2015-10-12 MED ORDER — MAGNESIUM HYDROXIDE 400 MG/5ML PO SUSP: 30.0000 mL | Freq: Every day | ORAL | Status: DC | PRN

## 2015-10-12 NOTE — ED Notes (Signed)
Patient asleep in room. No noted distress or abnormal behavior. Will continue 15 minute checks and observation by security cameras for safety. 

## 2015-10-12 NOTE — Tx Team (Signed)
Initial Interdisciplinary Treatment Plan   PATIENT STRESSORS: Marital or family conflict Medication change or noncompliance Substance abuse   PATIENT STRENGTHS: Average or above average intelligence Communication skills Motivation for treatment/growth   PROBLEM LIST: Problem List/Patient Goals Date to be addressed Date deferred Reason deferred Estimated date of resolution  Substance abuse 10/12/2015     Major depression 10/12/2015                                                DISCHARGE CRITERIA:  Ability to meet basic life and health needs Motivation to continue treatment in a less acute level of care Reduction of life-threatening or endangering symptoms to within safe limits  PRELIMINARY DISCHARGE PLAN: Attend aftercare/continuing care group Placement in alternative living arrangements  PATIENT/FAMIILY INVOLVEMENT: This treatment plan has been presented to and reviewed with the patient, Alan Byrd, and/or family member,   The patient and family have been given the opportunity to ask questions and make suggestions.  Margo CommonGigi [name] [name] [name] 10/12/2015, 3:14 PM

## 2015-10-12 NOTE — [facility] Suicide Risk Assessment (Signed)
[facility] Admission Suicide Risk Assessment   Nursing information obtained from:    Demographic factors:    Current Mental Status:    Loss Factors:    Historical Factors:    Risk Reduction Factors:     Total Time spent with patient: 1 hour Principal Problem: Severe recurrent major depressive disorder with psychotic features [facility]) Diagnosis:   Patient Active Problem List   Diagnosis Date Noted  . 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 Tobacco use disorder [F17.200] 10/12/2015  . Homicidal ideation [R45.850] 10/11/2015  . Suicidal ideation [R45.851] 10/11/2015  . Alcohol use disorder, severe, dependence (HCC) [F10.20] 10/11/2015  . Cocaine use disorder, severe, dependence (HCC) [F14.20] 10/11/2015  . PTSD (post-traumatic stress disorder) [F43.10] 10/11/2015  . Hypertension [I10] 10/11/2015  . Diabetes mellitus without complication (HCC) [E11.9] 10/11/2015   Subjective Data: Depression, suicidal ideation, substance use.  Continued Clinical Symptoms:    The "Alcohol Use Disorders Identification Test", Guidelines for Use in Primary Care, Second Edition.  World Science writer [facility]). Score between 0-7:  no or low risk or alcohol related problems. Score between 8-15:  moderate risk of alcohol related problems. Score between 16-19:  high risk of alcohol related problems. Score 20 or above:  warrants further diagnostic evaluation for alcohol dependence and treatment.   CLINICAL FACTORS:   Depression:   Comorbid alcohol abuse/dependence Impulsivity Alcohol/Substance Abuse/Dependencies   Musculoskeletal: Strength & Muscle Tone: within normal limits Gait & Station: normal Patient leans: N/A  Psychiatric Specialty Exam: Review of Systems  Psychiatric/Behavioral: Positive for depression, suicidal ideas and substance abuse.  All other systems reviewed and are negative.   There were no vitals taken for this visit.There is no  weight on file to calculate BMI.  General Appearance: Fairly Groomed  Patent attorney::  Fair  Speech:  Clear and Coherent  Volume:  Normal  Mood:  Depressed  Affect:  Blunt  Thought Process:  Circumstantial  Orientation:  Full (Time, Place, and Person)  Thought Content:  WDL  Suicidal Thoughts:  Yes.  with intent/plan  Homicidal Thoughts:  Yes.  with intent/plan  Memory:  Immediate;   Fair Recent;   Fair Remote;   Fair  Judgement:  Poor  Insight:  Lacking  Psychomotor Activity:  Decreased  Concentration:  Fair  Recall:  [employer] of Knowledge:Fair  Language: Fair  Akathisia:  No  Handed:  Right  AIMS (if indicated):     Assets:  Communication Skills Desire for Improvement Financial Resources/Insurance Resilience  Sleep:     Cognition: WNL  ADL's:  Intact    COGNITIVE FEATURES THAT CONTRIBUTE TO RISK:  None    SUICIDE RISK:   Moderate:  Frequent suicidal ideation with limited intensity, and duration, some specificity in terms of plans, no associated intent, good self-control, limited dysphoria/symptomatology, some risk factors present, and identifiable protective factors, including available and accessible social support.  PLAN OF CARE: Hospital admission, medication management, alcohol detox, substance abuse counseling, discharge planning.  Mr. Alan Byrd is a 70 year old male with a history of depression and substance abuse admitted for suicidal and homicidal threats in the context of substance use.  1. Suicidal and homicidal ideation. The patient is able to contract for safety in the hospital.  2. Mood. He is on Remeron.  3. Alcohol detox. We will monitor for symptoms of withdrawal.  4. Substance abuse treatment. The patient has been in treatment multiple times and wants to be transferred to the [facility]  for that.  5. Hypertension. He is on lisinopril.  6. Diabetes.  7. Smoking. Nicotine patch is available.  8. Disposition. To be established. [facility]       I certify that inpatient services furnished can reasonably be expected to improve the patient's condition.   Kristine LineaJolanta Rexanne Inocencio, MD 10/12/2015, 12:30 PM 3

## 2015-10-12 NOTE — [facility] Group Notes (Signed)
[facility] LCSW Group Therapy   10/12/2015 1:15 PM   Type of Therapy: Group Therapy   Participation Level: Active   Participation Quality: Attentive, Sharing and Supportive   Affect: Depressed and Flat   Cognitive: Alert and Oriented   Insight: Developing/Improving and Engaged   Engagement in Therapy: Developing/Improving and Engaged   Modes of Intervention: Clarification, Confrontation, Discussion, Education, Exploration, Limit-setting, Orientation, Problem-solving, Rapport Building, Dance movement psychotherapisteality Testing, Socialization and Support   Summary of Progress/Problems: The topic for today was feelings about relapse. Pt discussed what relapse prevention is to them and identified triggers that they are on the path to relapse. Pt processed their feeling towards relapse and was able to relate to peers. Pt discussed coping skills that can be used for relapse prevention. Pt shared that for him relapse meant not being able to be around his significant other due to his PTSD symptoms.  Pt shared that taking his meds, expressing his feelings and trusting the doctors will be key in his recovery.  Pt shared that he identifies that not seeing mental health practitioners because of his fear of trusting others was key to him relapsing.   Pt identified his angry girlfriend as a trigger.  Pt shared his first goal is to find a doctor and a therapist upon discharge he can trust.  CSW actively validated the pt's opinion and provided feedback.  Pt was polite and cooperative with the CSW and other group members and focused and attentive to the topics discussed and the sharing of others.     Dorothe PeaJonathan F. Maika Kaczmarek, MSW, LCSWA, LCAS

## 2015-10-12 NOTE — Progress Notes (Signed)
70 yrs old male admitted with major depression with psychotic features.Skin assessment & body search done.No contraband found.Patient denies suicidal ideations,verbalized homicidal ideations towards his friend.Cooperative with admission.Oriented to unit.

## 2015-10-12 NOTE — ED Provider Notes (Signed)
-----------------------------------------   7:54 AM on 10/12/2015 -----------------------------------------   Blood pressure 143/95, pulse 85, temperature 98 F (36.7 C), temperature source Oral, resp. rate 18, height 6\' 2"  (1.88 m), weight 225 lb (102.059 kg), SpO2 100 %.  The patient had no acute events since last update.  Calm and cooperative at this time.  Disposition is pending per Psychiatry/Behavioral Medicine team recommendations.     Jeanmarie PlantJames A Zali Kamaka, MD 10/12/15 [phone]

## 2015-10-12 NOTE — ED Notes (Signed)
Pt resting in bed. [facility] Pt accepting. No concerns or needs voiced. No distress noted.  Maintained on 15 minute checks and observation by security camera for safety.

## 2015-10-12 NOTE — H&P (Signed)
Psychiatric Admission Assessment Adult  Patient Identification: Alan Byrd MRN:  [id number] Date of Evaluation:  10/12/2015 Chief Complaint:  substance induced mood disorder Principal Diagnosis: Severe recurrent major depressive disorder with psychotic features ([place]) Diagnosis:   Patient Active Problem List   Diagnosis Date Noted  . Severe recurrent major depressive disorder with psychotic features ([place]) [F33.3] 10/12/2015  . Tobacco use disorder [F17.200] 10/12/2015  . Homicidal ideation [R45.850] 10/11/2015  . Suicidal ideation [R45.851] 10/11/2015  . Alcohol use disorder, severe, dependence ([place]) [F10.20] 10/11/2015  . Cocaine use disorder, severe, dependence ([place]) [F14.20] 10/11/2015  . PTSD (post-traumatic stress disorder) [F43.10] 10/11/2015  . Hypertension [I10] 10/11/2015  . Diabetes mellitus without complication ([place]) [F81.0] 10/11/2015   History of Present Illness:  Identifying data. Alan Byrd is a 70 year old male with history of depression, anxiety, and substance use.  Chief complaint. "I need help."  History of present illness. Information was obtained from the patient and the chart. The patient has a long history of cocaine and alcohol use with no period of sobriety to speak of. He has been hospitalized multiple times at the [place] system and participated in substance abuse treatment programs to no avail. He was brought to the hospital by police after application with a friend in his treatment. They have been drinking to get an started arguing about the woman. In the emergency room the patient expressed thoughts of killing himself but also his friend and wanted help with his depression and substance use. He reports poor sleep, decreased appetite, anhedonia, feeling of guilt and hopelessness worthlessness, poor energy and concentration, crying spells and social isolation. He denies feeling suicidal until he had argument with a friend. He did not have a plan. In addition to  symptoms of depression he reports symptoms of anxiety of PTSD type. He denies psychotic symptoms. He denies symptoms suggestive of bipolar mania. He has been drinking beer and liquor and using cocaine daily.  Past psychiatric history. Long history of depression, anxiety, and mood instability that is most likely associated with excessive drinking and cocaine use. He has been admitted to [facility] before but does not remember any medications that were prescribed. In any case he is not compliant in the community. He denies ever attempting suicide.  Family psychiatric history none reported.  Social history. [facility]. He lives with his girlfriend. He would like to be transferred to the [facility].  Total Time spent with patient: 1 hour  Past Psychiatric History: Depression, mood instability substances.  Is the patient at risk to self? Yes.    Has the patient been a risk to self in the past 6 months? No.  Has the patient been a risk to self within the distant past? No.  Is the patient a risk to others? Yes.    Has the patient been a risk to others in the past 6 months? No.  Has the patient been a risk to others within the distant past? No.   Prior Inpatient Therapy:   Prior Outpatient Therapy:    Alcohol Screening:   Substance Abuse History in the last 12 months:  Yes.   Consequences of Substance Abuse: Negative Previous Psychotropic Medications: Yes  Psychological Evaluations: No  Past Medical History:  Past Medical History  Diagnosis Date  . Pancreatitis   . Hypertension   . Mood swings ([place])   . Diabetes mellitus without complication ([place])    History reviewed. No pertinent past surgical history. Family History: History reviewed.  No pertinent family history. Family Psychiatric  History: None reported.  Tobacco Screening: _0 ([phone])::1)@ Social History:  History  Alcohol Use  . Yes    Comment: st pint vodka and 3-4 40oz beer and whisky daily;  no ETOH in 2 days     History  Drug Use No    Additional Social History:                           Allergies:   Allergies  Allergen Reactions  . Penicillins Rash   Lab Results:  Results for orders placed or performed during the hospital encounter of 10/11/15 (from the past 48 hour(s))  Comprehensive metabolic panel     Status: Abnormal   Collection Time: 10/11/15 12:30 AM  Result Value Ref Range   Sodium 138 135 - 145 mmol/L   Potassium 3.7 3.5 - 5.1 mmol/L   Chloride 108 101 - 111 mmol/L   CO2 23 22 - 32 mmol/L   Glucose, Bld 270 (H) 65 - 99 mg/dL   BUN 7 6 - 20 mg/dL   Creatinine, Ser 1.07 0.61 - 1.24 mg/dL   Calcium 8.5 (L) 8.9 - 10.3 mg/dL   Total Protein 8.7 (H) 6.5 - 8.1 g/dL   Albumin 4.1 3.5 - 5.0 g/dL   AST 181 (H) 15 - 41 U/L   ALT 189 (H) 17 - 63 U/L   Alkaline Phosphatase 84 38 - 126 U/L   Total Bilirubin 0.4 0.3 - 1.2 mg/dL   GFR calc non Af Amer >60 >60 mL/min   GFR calc Af Amer >60 >60 mL/min    Comment: (NOTE) The eGFR has been calculated using the CKD EPI equation. This calculation has not been validated in all clinical situations. eGFR's persistently <60 mL/min signify possible Chronic Kidney Disease.    Anion gap 7 5 - 15  Ethanol (ETOH)     Status: Abnormal   Collection Time: 10/11/15 12:30 AM  Result Value Ref Range   Alcohol, Ethyl (B) 236 (H) <5 mg/dL    Comment:        LOWEST DETECTABLE LIMIT FOR SERUM ALCOHOL IS 5 mg/dL FOR MEDICAL PURPOSES ONLY   Salicylate level     Status: None   Collection Time: 10/11/15 12:30 AM  Result Value Ref Range   Salicylate Lvl <3.7 2.8 - 30.0 mg/dL  Acetaminophen level     Status: Abnormal   Collection Time: 10/11/15 12:30 AM  Result Value Ref Range   Acetaminophen (Tylenol), Serum <10 (L) 10 - 30 ug/mL    Comment:        THERAPEUTIC CONCENTRATIONS VARY SIGNIFICANTLY. A RANGE OF 10-30 ug/mL MAY BE AN EFFECTIVE CONCENTRATION FOR MANY PATIENTS. HOWEVER, SOME ARE BEST TREATED AT  CONCENTRATIONS OUTSIDE THIS RANGE. ACETAMINOPHEN CONCENTRATIONS >150 ug/mL AT 4 HOURS AFTER INGESTION AND >50 ug/mL AT 12 HOURS AFTER INGESTION ARE OFTEN ASSOCIATED WITH TOXIC REACTIONS.   CBC     Status: Abnormal   Collection Time: 10/11/15 12:30 AM  Result Value Ref Range   WBC 11.5 (H) 3.8 - 10.6 K/uL   RBC 4.94 4.40 - 5.90 MIL/uL   Hemoglobin 15.3 13.0 - 18.0 g/dL   HCT 46.0 40.0 - 52.0 %   MCV 93.2 80.0 - 100.0 fL   MCH 31.0 26.0 - 34.0 pg   MCHC 33.3 32.0 - 36.0 g/dL   RDW 14.3 11.5 - 14.5 %   Platelets 200 150 - 440 K/uL  Urine Drug  Screen, Qualitative ([place] only)     Status: Abnormal   Collection Time: 10/11/15 12:30 AM  Result Value Ref Range   Tricyclic, Ur Screen NONE DETECTED NONE DETECTED   Amphetamines, Ur Screen NONE DETECTED NONE DETECTED   MDMA (Ecstasy)Ur Screen NONE DETECTED NONE DETECTED   Cocaine Metabolite,Ur [place] POSITIVE (A) NONE DETECTED   Opiate, Ur Screen NONE DETECTED NONE DETECTED   Phencyclidine (PCP) Ur S NONE DETECTED NONE DETECTED   Cannabinoid 50 Ng, Ur [place] NONE DETECTED NONE DETECTED   Barbiturates, Ur Screen NONE DETECTED NONE DETECTED   Benzodiazepine, Ur Scrn NONE DETECTED NONE DETECTED   Methadone Scn, Ur NONE DETECTED NONE DETECTED    Comment: (NOTE) 654  Tricyclics, urine               Cutoff 1000 ng/mL 200  Amphetamines, urine             Cutoff 1000 ng/mL 300  MDMA (Ecstasy), urine           Cutoff 500 ng/mL 400  Cocaine Metabolite, urine       Cutoff 300 ng/mL 500  Opiate, urine                   Cutoff 300 ng/mL 600  Phencyclidine (PCP), urine      Cutoff 25 ng/mL 700  Cannabinoid, urine              Cutoff 50 ng/mL 800  Barbiturates, urine             Cutoff 200 ng/mL 900  Benzodiazepine, urine           Cutoff 200 ng/mL 1000 Methadone, urine                Cutoff 300 ng/mL 1100 1200 The urine drug screen provides only a preliminary, unconfirmed 1300 analytical test result and should not be used for non-medical 1400 purposes.  Clinical consideration and professional judgment should 1500 be applied to any positive drug screen result due to possible 1600 interfering substances. A more specific alternate chemical method 1700 must be used in order to obtain a confirmed analytical result.  1800 Gas chromato graphy / mass spectrometry (GC/MS) is the preferred 1900 confirmatory method.   Glucose, capillary     Status: Abnormal   Collection Time: 10/11/15  2:44 PM  Result Value Ref Range   Glucose-Capillary 188 (H) 65 - 99 mg/dL    Blood Alcohol level:  Lab Results  Component Value Date   ETH 236* 65/09/5463    Metabolic Disorder Labs:  No results found for: HGBA1C, MPG No results found for: PROLACTIN No results found for: CHOL, TRIG, HDL, CHOLHDL, VLDL, LDLCALC  Current Medications: No current facility-administered medications for this encounter.   PTA Medications: Prescriptions prior to admission  Medication Sig Dispense Refill Last Dose  . lisinopril (PRINIVIL,ZESTRIL) 5 MG tablet Take 5 mg by mouth daily.   10/10/2015 at Unknown time  . mirtazapine (REMERON) 30 MG tablet Take 30 mg by mouth at bedtime.   10/10/2015 at Unknown time  . oxyCODONE-acetaminophen (PERCOCET/ROXICET) 5-325 MG tablet Take 1 tablet by mouth every 4 (four) hours as needed for severe pain.   PRN at PRN  . traZODone (DESYREL) 50 MG tablet Take 50 mg by mouth at bedtime.   10/10/2015 at Unknown time    Musculoskeletal: Strength & Muscle Tone: within normal limits Gait & Station: normal Patient leans: N/A  Psychiatric Specialty Exam: I reviewed physical exam  performed in the emergency room and agree with the findings.  Physical Exam  Nursing note and vitals reviewed.   Review of Systems  Psychiatric/Behavioral: Positive for depression, suicidal ideas and substance abuse.  All other systems reviewed and are negative.   There were no vitals taken for this visit.There is no weight on file to calculate BMI.  See SRA.                                                   Sleep:        Treatment Plan Summary: Daily contact with patient to assess and evaluate symptoms and progress in treatment and Medication management   Mr. Mccaskey is a 70 year old male with a history of depression and substance abuse admitted for suicidal and homicidal threats in the context of substance use.  1. Suicidal and homicidal ideation. The patient is able to contract for safety in the hospital.  2. Mood. He is on Remeron.   3. Alcohol detox. We will monitor for symptoms of withdrawal. I will start Librium taper.  4. Substance abuse treatment. The patient has been in treatment multiple times and wants to be transferred to the [facility] for long term treatment.  5. Hypertension. He is on HCTZ.  6. Diabetes. He is on metformin, ADA diet and blood glucose monitoring.   7. Smoking. Nicotine patch is available.  8.  insomnia. He is on trazodone.   9. Disposition. To be established. He will follow up with the [place].     Observation Level/Precautions:  15 minute checks  Laboratory:  CBC Chemistry Profile UDS UA  Psychotherapy:    Medications:    Consultations:    Discharge Concerns:    Estimated LOS:  Other:     I certify that inpatient services furnished can reasonably be expected to improve the patient's condition.    Orson Slick, MD 3/24/201712:35 PM

## 2015-10-12 NOTE — ED Notes (Signed)
Pt will be moved after he eats lunch. Maintained on 15 minute checks and observation by security camera for safety.

## 2015-10-12 NOTE — [facility] Group Notes (Signed)
[facility] LCSW Group Therapy   10/12/2015 11am  Type of Therapy: Group Therapy   Participation Level: Did Not Attend. Patient invited to participate but declined.    Alan PeaJonathan F. Leahann Byrd, MSW, LCSWA, LCAS
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
## 2015-10-12 NOTE — [facility] Group Notes (Signed)
[facility] Group Notes:  (Nursing/MHT/Case Management/Adjunct)  Date:  10/12/2015  Time:  11:54 AM  Type of Therapy:  Movement Therapy  Participation Level:  Did Not Attend  Alan Byrd 10/12/2015, 11:54 AM
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
## 2015-10-13 DIAGNOSIS — R45850 Homicidal ideations: Secondary | ICD-10-CM

## 2015-10-13 DIAGNOSIS — F333 Major depressive disorder, recurrent, severe with psychotic symptoms: Secondary | ICD-10-CM

## 2015-10-13 LAB — GLUCOSE, CAPILLARY
GLUCOSE-CAPILLARY: 185 mg/dL — AB (ref 65–99)
GLUCOSE-CAPILLARY: 292 mg/dL — AB (ref 65–99)
Glucose-Capillary: 279 mg/dL — ABNORMAL HIGH (ref 65–99)
Glucose-Capillary: 194 mg/dL — ABNORMAL HIGH (ref 65–99)

## 2015-10-13 MED ORDER — INFLUENZA VAC SPLIT QUAD 0.5 ML IM SUSY
0.5000 mL | PREFILLED_SYRINGE | INTRAMUSCULAR | Status: AC
  Administered 2015-10-14: 0.5 mL via INTRAMUSCULAR
  Filled 2015-10-13: qty 0.5

## 2015-10-13 NOTE — Progress Notes (Signed)
D:  Patient is alert and oriented on the unit this shift.  Patient attended and actively participated in groups today.  Patient denies suicidal ideation, auditory or visual hallucinations at the present time.  Patient endorses homicidal ideation towards "somebody on the street."  [facility]   A:  Scheduled medications are administered to patient as per MD orders.  Emotional support and encouragement are provided.  Patient is maintained on q.15 minute safety checks.  Patient is informed to notify staff with questions or concerns. R:  No adverse medication reactions are noted.  Patient is cooperative with medication administration and treatment plan today.  Patient is receptive, calm and cooperative on the unit at this time.  Patient interacts well with others on the unit this shift.  Patient contracts for safety at this time.  Patient remains safe at this time.

## 2015-10-13 NOTE — Plan of Care (Signed)
Problem: Alteration in mood Goal: LTG-Patient reports reduction in suicidal thoughts (Patient reports reduction in suicidal thoughts and is able to verbalize a safety plan for whenever patient is feeling suicidal)  Outcome: Progressing Patient denies SI.      
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
## 2015-10-13 NOTE — [facility] Group Notes (Signed)
[facility] Group Notes:  (Nursing/MHT/Case Management/Adjunct)  Date:  10/13/2015  Time:  3:36 AM  Type of Therapy:  Group Therapy  Participation Level:  Active  Participation Quality:  Appropriate  Affect:  Appropriate  Cognitive:  Appropriate  Insight:  Appropriate  Engagement in Group:  Engaged  Modes of Intervention:  Discussion  Summary of Progress/Problems:  Alan Byrd Alan Byrd 10/13/2015, 3:36 AM

## 2015-10-13 NOTE — Progress Notes (Signed)
D: Pt denies SI/HI/AVH. Pt is pleasant and cooperative. Affect is flat but brightens upon approach. Pt appears less anxious and he is interacting with peers and staff appropriately.  A: Pt was offered support and encouragement. Pt was given scheduled medications. Pt was encouraged to attend groups. Q 15 minute checks were done for safety.  R:Pt attends groups and interacts well with peers and staff. Pt is taking medication. Pt has no complaints.Pt receptive to treatment and safety maintained on unit.

## 2015-10-13 NOTE — Plan of Care (Signed)
Problem: Alteration in mood Goal: STG-Patient is able to discuss feelings and issues (Patient is able to discuss feelings and issues leading to depression)  Outcome: Progressing Patient was willing to discuss his current concerns this shift with this Clinical research associatewriter.

## 2015-10-13 NOTE — Plan of Care (Signed)
Problem: Ineffective individual coping Goal: STG: Patient will remain free from self harm Pt safe on the unit at this time  Problem: Alteration in mood Goal: LTG-Patient reports reduction in suicidal thoughts (Patient reports reduction in suicidal thoughts and is able to verbalize a safety plan for whenever patient is feeling suicidal)  Outcome: Progressing Pt denies SI at this time
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
## 2015-10-13 NOTE — [facility] Group Notes (Signed)
[facility] LCSW Group Therapy  10/13/2015 3:52 PM  Type of Therapy:  Group Therapy  Participation Level:  Minimal  Participation Quality:  Attentive  Affect:  Flat  Cognitive:  Alert  Insight:  Limited  Engagement in Therapy:  Limited  Modes of Intervention:  Discussion, Education, Socialization and Support  Summary of Progress/Problems: Pt will identify unhealthy thoughts and how they impact their emotions and behavior. Pt will be encouraged to discuss these thoughts, emotions and behaviors with the group. Pt attended group and stayed the entire time. He sat quietly and listened to other group members.   Marlana Mckowen L Presli Fanguy MSW, LCSWA  10/13/2015, 3:52 PM   
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
## 2015-10-13 NOTE — Progress Notes (Signed)
Patient's CBG was noted to be rising slowly with each check.  Blood sugar at 1200 was 279.  MD was informed.  Patient was put on a carb modified diet and CBG's 4 times a day.

## 2015-10-14 DIAGNOSIS — F323 Major depressive disorder, single episode, severe with psychotic features: Secondary | ICD-10-CM | POA: Diagnosis not present

## 2015-10-14 LAB — GLUCOSE, CAPILLARY
GLUCOSE-CAPILLARY: 234 mg/dL — AB (ref 65–99)
GLUCOSE-CAPILLARY: 260 mg/dL — AB (ref 65–99)
GLUCOSE-CAPILLARY: 199 mg/dL — AB (ref 65–99)
Glucose-Capillary: 191 mg/dL — ABNORMAL HIGH (ref 65–99)

## 2015-10-14 NOTE — Plan of Care (Signed)
Problem: Ineffective individual coping Goal: STG: Patient will remain free from self harm Outcome: Progressing Pt safe on the unit at this time  Problem: Alteration in mood Goal: LTG-Patient reports reduction in suicidal thoughts (Patient reports reduction in suicidal thoughts and is able to verbalize a safety plan for whenever patient is feeling suicidal)  Outcome: Progressing Pt denies SI at this time     

## 2015-10-14 NOTE — BHH Group Notes (Signed)
[facility] Group Notes:  (Nursing/MHT/Case Management/Adjunct)  Date:  10/14/2015  Time:  1:44 AM  Type of Therapy:  Group Therapy  Participation Level:  Active  Participation Quality:  Appropriate  Affect:  Appropriate  Cognitive:  Appropriate  Insight:  Appropriate  Engagement in Group:  Engaged  Modes of Intervention:  n/a  Summary of Progress/Problems:  Miki Blank Imani Nakyla Bracco 10/14/2015, 1:44 AM 
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
## 2015-10-14 NOTE — Progress Notes (Signed)
Patient is alert and oriented, no signs of pain or distress noted.  Patient completed his self inventory sheet and rated his depression and feelings of hopelessness 10/10.  This Clinical research associatewriter spoke to patient regarding high level of anxiety and expressed it was due to the inability to find a 30 day program.  Patient stated that he did not want to get discharged on Wed and have to return to the same situation.  Patient encouraged to continue to work with [place] social workers and [place] to [place] places.    Patient is medication complaint at this time.  Patient interacts appropriately with his peers and attends group session. Patient denies SI/HI/AVH at this time.  Will continue to monitor for safety

## 2015-10-14 NOTE — [facility] Group Notes (Signed)
[facility] LCSW Group Therapy  10/14/2015 2:12 PM  Type of Therapy:  Group Therapy  Participation Level:  Minimal  Participation Quality:  Attentive  Affect:  Flat  Cognitive:  Alert  Insight:  Limited  Engagement in Therapy:  Limited  Modes of Intervention:  Discussion, Education, Socialization and Support  Summary of Progress/Problems: Balance in life: Patients will discuss the concept of balance and how it looks and feels to be unbalanced. Pt will identify areas in their life that is unbalanced and ways to become more balanced.  Pt attended group and stayed the entire time. He sat quietly and listened to other group members share.   Melane Windholz L Skyla Champagne MSW, LCSWA  10/14/2015, 2:12 PM   

## 2015-10-15 LAB — GLUCOSE, CAPILLARY
Glucose-Capillary: 228 mg/dL — ABNORMAL HIGH (ref 65–99)
Glucose-Capillary: 239 mg/dL — ABNORMAL HIGH (ref 65–99)
Glucose-Capillary: 225 mg/dL — ABNORMAL HIGH (ref 65–99)
Glucose-Capillary: 253 mg/dL — ABNORMAL HIGH (ref 65–99)

## 2015-10-15 MED ORDER — INSULIN ASPART 100 UNIT/ML ~~LOC~~ SOLN
0.0000 [IU] | Freq: Three times a day (TID) | SUBCUTANEOUS | Status: DC
  Administered 2015-10-15: 5 [IU] via SUBCUTANEOUS
  Administered 2015-10-16 – 2015-10-17 (×4): 8 [IU] via SUBCUTANEOUS
  Filled 2015-10-15: qty 5
  Filled 2015-10-15 (×4): qty 8

## 2015-10-15 NOTE — [facility] Group Notes (Signed)
[facility] Group Notes:  (Nursing/MHT/Case Management/Adjunct)  Date:  10/15/2015  Time:  4:25 AM  Type of Therapy:  Group Therapy  Participation Level:  Active  Participation Quality:  Appropriate  Affect:  Appropriate  Cognitive:  Appropriate  Insight:  Appropriate  Engagement in Group:  Engaged  Modes of Intervention:  n/a  Summary of Progress/Problems:  Veva Holesshley Imani Deyci Gesell 10/15/2015, 4:25 AM

## 2015-10-15 NOTE — Progress Notes (Signed)
Recreation Therapy Notes  Date: 03.27.17 Time: 3:00 pm Location: Craft Room  Group Topic: Self-expression  Goal Area(s) Addresses:  Patient will write at least one emotion they are experiencing. Patient will verbalize benefit of using art as a means of self-expression.  Behavioral Response: Attentive  Intervention: Bottled Up  Activity: Patients were instructed to draw a bottle the way they viewed themselves. LRT provided examples. Patients were instructed to write the emotions they were feeling inside the bottle.  Education: LRT educated patients on different forms of self-expression.  Education Outcome: In group clarification offered   Clinical Observations/Feedback: Patient completed activity by drawing a bottle and writing his emotions. Patient did not contribute to group discussion.  Jacquelynn CreeGreene,Aamya Orellana M, LRT/CTRS 10/15/2015 4:21 PM

## 2015-10-15 NOTE — Plan of Care (Signed)
Problem: Ineffective individual coping Goal: STG-Increase in ability to manage activities of daily living Outcome: Progressing Patient is able to manage his activities of daily living currently     

## 2015-10-15 NOTE — Plan of Care (Signed)
Problem: Alteration in mood Goal: STG-Pt Able to Identify Plan For Continuing Care at D/C Pt. Will be able to identify a plan for continuing care at discharge  Outcome: Not Progressing Patient has no plans for discharge at this time

## 2015-10-15 NOTE — [facility] Group Notes (Signed)
[facility] LCSW Aftercare Discharge Planning Group Note  10/15/2015 9:30 AM  Participation Quality: Did Not Attend. Patient invited to participate but declined.   Zorah Backes F. Kaylah Chiasson, MSW, LCSWA, LCAS   

## 2015-10-15 NOTE — Plan of Care (Signed)
Problem: Alteration in mood Goal: LTG-Pt's behavior demonstrates decreased signs of depression (Patient's behavior demonstrates decreased signs of depression to the point the patient is safe to return home and continue treatment in an outpatient setting)  Outcome: Progressing Patient states that his mood is better today than it was yesterday
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
## 2015-10-15 NOTE — Progress Notes (Signed)
D: Pt denies SI, pt continues to have thoughts of HI to person outside hospital. Pt is pleasant and cooperative. Pt goal for today is to work anger issues with outside person he wants to harm A: Pt was offered support and encouragement. Pt was given scheduled medications. Pt was encourage to attend groups. Q 15 minute checks were done for safety.  R:Pt attends groups and interacts well with peers and staff. Pt is taking medication. Pt has no complaints.Pt receptive to treatment and safety maintained on unit.

## 2015-10-15 NOTE — Plan of Care (Signed)
Problem: Alteration in mood Goal: LTG-Patient reports reduction in suicidal thoughts (Patient reports reduction in suicidal thoughts and is able to verbalize a safety plan for whenever patient is feeling suicidal)  Outcome: Progressing Patient has no SI at this time

## 2015-10-15 NOTE — [facility] Group Notes (Signed)
[facility] Group Notes:  (Nursing/MHT/Case Management/Adjunct)  Date:  10/15/2015  Time:  3:14 PM  Type of Therapy:  Psychoeducational Skills  Participation Level:  Active  Participation Quality:  Appropriate, Attentive and Sharing  Affect:  Appropriate  Cognitive:  Appropriate  Insight:  Appropriate  Engagement in Group:  Engaged  Modes of Intervention:  Discussion, Education and Support  Summary of Progress/Problems:  Lynelle SmokeCara Travis [facility] 10/15/2015, 3:14 PM

## 2015-10-15 NOTE — Progress Notes (Signed)
Inpatient Diabetes Program Recommendations  AACE/ADA: New Consensus Statement on Inpatient Glycemic Control (2015)  Target Ranges:  Prepandial:   less than 140 mg/dL      Peak postprandial:   less than 180 mg/dL (1-2 hours)      Critically ill patients:  140 - 180 mg/dL   Review of Glycemic Control:  Results for Alan Byrd, Alan Byrd (MRN [id number]) as of 10/15/2015 11:26  Ref. Range 10/14/2015 06:43 10/14/2015 12:39 10/14/2015 16:24 10/14/2015 20:34 10/15/2015 06:40  Glucose-Capillary Latest Ref Range: 65-99 mg/dL 621234 (H) 308191 (H) 657260 (H) 199 (H) 228 (H)    Diabetes history: Type 2 diabetes Outpatient Diabetes medications: None listed Current orders for Inpatient glycemic control:  Metformin 500 mg bid  Inpatient Diabetes Program Recommendations:    Note that patient is currently on Metformin for diabetes management.  May consider avoidance of Metformin due to history of ETOH abuse and elevated liver enzymes.  Consider adding Novolog moderate correction tid with meals and HS while in the hospital using "Glycemic control order set".  Note that patient may transfer to [facility].  Will need plan from there for outpatient diabetes management.    Thanks, Beryl MeagerJenny Isobella Ascher, RN, BC-ADM Inpatient Diabetes Coordinator Pager [phone] (8a-5p)

## 2015-10-15 NOTE — [facility] Group Notes (Signed)
[facility] LCSW Group Therapy   10/15/2015 1 pm Type of Therapy: Group Therapy   Participation Level: Active   Participation Quality: Attentive, Sharing and Supportive   Affect: Depressed and Flat   Cognitive: Alert and Oriented   Insight: Developing/Improving and Engaged   Engagement in Therapy: Developing/Improving and Engaged   Modes of Intervention: Clarification, Confrontation, Discussion, Education, Exploration,  Limit-setting, Orientation, Problem-solving, Rapport Building, Dance movement psychotherapisteality Testing, Socialization and Support   Summary of Progress/Problems: Pt identified obstacles faced currently and processed barriers involved in overcoming these obstacles. Pt identified steps necessary for overcoming these obstacles and explored motivation (internal and external) for facing these difficulties head on. Pt further identified one area of concern in their lives and chose a goal to focus on for today. Pt shared the major obstacle he faces in his life is addiction and alcoholism.  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  CSW actively validated the pt's opinion and provided feedback.  Pt was polite and cooperative with the CSW and other group members and focused and attentive to the topics discussed and the sharing of others.  Dorothe PeaJonathan F. Shian Goodnow, LCSWA, LCAS  10/15/15

## 2015-10-15 NOTE — Plan of Care (Signed)
Problem: Alteration in mood Goal: LTG-Pt's behavior demonstrates decreased signs of depression (Patient's behavior demonstrates decreased signs of depression to the point the patient is safe to return home and continue treatment in an outpatient setting)  Outcome: Progressing Patient reports no depression at this time
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
## 2015-10-16 LAB — GLUCOSE, CAPILLARY
GLUCOSE-CAPILLARY: 287 mg/dL — AB (ref 65–99)
GLUCOSE-CAPILLARY: 280 mg/dL — AB (ref 65–99)
Glucose-Capillary: 261 mg/dL — ABNORMAL HIGH (ref 65–99)
Glucose-Capillary: 264 mg/dL — ABNORMAL HIGH (ref 65–99)

## 2015-10-16 NOTE — Progress Notes (Signed)
D: Pt denies SI patient still having HI, Pt is  cooperative. Pt goal for today is to work on his plans for discharge to go to the [facility] hospital                                                                                                                                                                         A: Pt was offered support and encouragement. Pt was given scheduled medications. Pt was encourage to attend groups. Q 15 minute checks were done for safety.  R:Pt attends groups and interacts well with peers and staff. Pt is taking medication. Pt has no complaints.Pt receptive to treatment and safety maintained on unit.

## 2015-10-16 NOTE — Progress Notes (Signed)
Recreation Therapy Notes  Date: 03.28.17 Time: 3:00 pm Location: Craft Room  Group Topic: Goal Setting  Goal Area(s) Addresses:  Patient will write at least one goal. Patient will write at least one obstacle.  Behavioral Response: Attentive, Left early  Intervention: Recovery Goal Chart  Activity: Patients were instructed to make a Recovery Goal Chart including goals, obstacles, the date they started working on their goals, and the date they achieved their goals.  Education: LRT educated patients on healthy ways to celebrate reaching their goals.  Education Outcome: Patient left before LRT educated group.  Clinical Observations/Feedback: Patient completed activity by writing goals and obstacles. Patient did not contribute to group discussion. Patient left group at approximately 3:35 pm.  Jacquelynn CreeGreene,Naseem Varden M, LRT/CTRS 10/16/2015 4:34 PM

## 2015-10-16 NOTE — [facility] Group Notes (Signed)
[facility] Group Notes:  (Nursing/MHT/Case Management/Adjunct)  Date:  10/16/2015  Time:  1:55 PM  Type of Therapy:  Psychoeducational Skills  Participation Level:  Active  Participation Quality:  Appropriate, Attentive, Sharing and Supportive  Affect:  Appropriate  Cognitive:  Appropriate  Insight:  Appropriate  Engagement in Group:  Limited and Supportive  Modes of Intervention:  Discussion and Education  Summary of Progress/Problems:  Alan Byrd 10/16/2015, 1:55 PM

## 2015-10-16 NOTE — Progress Notes (Signed)
[facility] MD Progress Note  10/16/2015 3:25 PM Alan Byrd  MRN:  [id number]  Subjective:  Alan Byrd still reports homicidal ideations with a plan to kill his roommate. Apparently this "friend" of his states that the house that the patient owns. He is thinking about getting a restraining order. For now he believes that this is unsafe for him to return to his own place. He completed alcohol detox. Vital signs are stable. He is pleasant and engaging. He takes medications and participate in programming. He is benefiting disappointed that there are no beds available at the [facility] system for substance abuse treatment. Social worker will discuss other treatment options with the patient. Anticipated discharge tomorrow.  Principal Problem: Severe recurrent major depressive disorder with psychotic features (HCC) Diagnosis:   Patient Active Problem List   Diagnosis Date Noted  . 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 Tobacco use disorder [F17.200] 10/12/2015  . Homicidal ideation [R45.850] 10/11/2015  . Suicidal ideation [R45.851] 10/11/2015  . Alcohol use disorder, severe, dependence (HCC) [F10.20] 10/11/2015  . Cocaine use disorder, severe, dependence (HCC) [F14.20] 10/11/2015  . PTSD (post-traumatic stress disorder) [F43.10] 10/11/2015  . Hypertension [I10] 10/11/2015  . Diabetes mellitus without complication (HCC) [E11.9] 10/11/2015   Total Time spent with patient: 20 minutes  Past Psychiatric History: Depression, mood instability, substance abuse.  Past Medical History:  Past Medical History  Diagnosis Date  . Pancreatitis   . Hypertension   . Mood swings (HCC)   . Diabetes mellitus without complication (HCC)    History reviewed. No pertinent past surgical history. Family History: History reviewed. No pertinent family history. Family Psychiatric  History: See H&P. Social History:  History  Alcohol Use  . Yes    Comment: st pint  vodka and 3-4 40oz beer and whisky daily; no ETOH in 2 days     History  Drug Use No    Social History   Social History  . Marital Status: Single    Spouse Name: N/A  . Number of Children: N/A  . Years of Education: N/A   Social History Main Topics  . Smoking status: Current Every Day Smoker -- 1.00 packs/day    Types: Cigarettes  . Smokeless tobacco: None  . Alcohol Use: Yes     Comment: st pint vodka and 3-4 40oz beer and whisky daily; no ETOH in 2 days  . Drug Use: No  . Sexual Activity: Not Asked   Other Topics Concern  . None   Social History Narrative   Additional Social History:    History of alcohol / drug use?: Yes Negative Consequences of Use: Legal                    Sleep: Fair  Appetite:  Fair  Current Medications: Current Facility-Administered Medications  Medication Dose Route Frequency Provider Last Rate Last Dose  . acetaminophen (TYLENOL) tablet 650 mg  650 mg Oral Q6H PRN Audery Amel, MD      . alum & mag hydroxide-simeth (MAALOX/MYLANTA) 200-200-20 MG/5ML suspension 30 mL  30 mL Oral Q4H PRN Audery Amel, MD      . hydrochlorothiazide (MICROZIDE) capsule 12.5 mg  12.5 mg Oral Daily Audery Amel, MD   12.5 mg at 10/16/15 0818  . insulin aspart (novoLOG) injection 0-15 Units  0-15 Units Subcutaneous TID WC Shari Prows, MD   8 Units at 10/16/15 1152  . magnesium hydroxide (MILK OF MAGNESIA)  suspension 30 mL  30 mL Oral Daily PRN Audery AmelJohn T Clapacs, MD      . mirtazapine (REMERON) tablet 15 mg  15 mg Oral QHS Shari ProwsJolanta B Leandrew Keech, MD   15 mg at 10/15/15 2123  . nicotine (NICODERM CQ - dosed in mg/24 hours) patch 21 mg  21 mg Transdermal Daily Shari ProwsJolanta B Depaul Arizpe, MD   21 mg at 10/16/15 0819  . traZODone (DESYREL) tablet 100 mg  100 mg Oral QHS Shari ProwsJolanta B Patric Buckhalter, MD   100 mg at 10/15/15 2202    Lab Results:  Results for orders placed or performed during the hospital encounter of 10/12/15 (from the past 48 hour(s))  Glucose,  capillary     Status: Abnormal   Collection Time: 10/14/15  4:24 PM  Result Value Ref Range   Glucose-Capillary 260 (H) 65 - 99 mg/dL  Glucose, capillary     Status: Abnormal   Collection Time: 10/14/15  8:34 PM  Result Value Ref Range   Glucose-Capillary 199 (H) 65 - 99 mg/dL   Comment 1 Notify RN    Comment 2 Document in Chart   Glucose, capillary     Status: Abnormal   Collection Time: 10/15/15  6:40 AM  Result Value Ref Range   Glucose-Capillary 228 (H) 65 - 99 mg/dL  Glucose, capillary     Status: Abnormal   Collection Time: 10/15/15 11:50 AM  Result Value Ref Range   Glucose-Capillary 239 (H) 65 - 99 mg/dL   Comment 1 Notify RN   Glucose, capillary     Status: Abnormal   Collection Time: 10/15/15  4:29 PM  Result Value Ref Range   Glucose-Capillary 225 (H) 65 - 99 mg/dL   Comment 1 Notify RN   Glucose, capillary     Status: Abnormal   Collection Time: 10/15/15  8:42 PM  Result Value Ref Range   Glucose-Capillary 253 (H) 65 - 99 mg/dL  Glucose, capillary     Status: Abnormal   Collection Time: 10/16/15  6:41 AM  Result Value Ref Range   Glucose-Capillary 261 (H) 65 - 99 mg/dL  Glucose, capillary     Status: Abnormal   Collection Time: 10/16/15 11:50 AM  Result Value Ref Range   Glucose-Capillary 287 (H) 65 - 99 mg/dL    Blood Alcohol level:  Lab Results  Component Value Date   ETH 236* 10/11/2015    Physical Findings: AIMS:  , ,  ,  , Dental Status Current problems with teeth and/or dentures?: No Does patient usually wear dentures?: No  CIWA:  CIWA-Ar Total: 1 COWS:  COWS Total Score: 1  Musculoskeletal: Strength & Muscle Tone: within normal limits Gait & Station: normal Patient leans: N/A  Psychiatric Specialty Exam: Review of Systems  All other systems reviewed and are negative.   Blood pressure 138/83, pulse 88, temperature 98 F (36.7 C), temperature source Oral, resp. rate 20, height 6\' 2"  (1.88 m), weight 104.781 kg (231 lb), SpO2 99 %.Body mass  index is 29.65 kg/(m^2).  General Appearance: Casual  Eye Contact::  Good  Speech:  Clear and Coherent  Volume:  Normal  Mood:  Anxious  Affect:  Appropriate  Thought Process:  Goal Directed  Orientation:  Full (Time, Place, and Person)  Thought Content:  WDL  Suicidal Thoughts:  No  Homicidal Thoughts:  Yes.  with intent/plan  Memory:  Immediate;   Fair Recent;   Fair Remote;   Fair  Judgement:  Poor  Insight:  Lacking  Psychomotor Activity:  Normal  Concentration:  Fair  Recall:  [employer] of Knowledge:Fair  Language: Fair  Akathisia:  No  Handed:  Right  AIMS (if indicated):     Assets:  Communication Skills Desire for Improvement Financial Resources/Insurance Housing Physical Health Resilience Social Support  ADL's:  Intact  Cognition: WNL  Sleep:  Number of Hours: 8   Treatment Plan Summary: Daily contact with patient to assess and evaluate symptoms and progress in treatment and Medication management   Alan Byrd is a 70 year old male with a history of depression and substance use admitted for homicidal threats in the context of substance use.  1. Suicidal/homicidal ideation. Still feels homicidal.  2. Mood. He was started on Remeron.  3. Alcohol abuse. He completed a brief Librium taper. Vital signs were stable.   4. Substance abuse treatment. He desires long term treatment at the [place].  5. Diabetes. We discontinue Insulin and started Novolog per SSI with ADA diet.  6. HTN. He is on HCTZ.  7. Insomnia. He is on Trazodone.  8. Discharge TBE. Follow up at the [place].   Kristine Linea, MD 10/16/2015, 3:25 PM

## 2015-10-16 NOTE — Plan of Care (Signed)
Problem: Ineffective individual coping Goal: LTG: Patient will report a decrease in negative feelings Outcome: Progressing No SI/HI at this time.      

## 2015-10-16 NOTE — [facility] Group Notes (Signed)
[facility] LCSW Group Therapy   10/16/2015 11:00 am  Type of Therapy: Group Therapy   Participation Level: Active   Participation Quality: Attentive, Sharing and Supportive   Affect: Appropriate  Cognitive: Alert and Oriented   Insight: Developing/Improving and Engaged   Engagement in Therapy: Developing/Improving and Engaged   Modes of Intervention: Clarification, Confrontation, Discussion, Education, Exploration,  Limit-setting, Orientation, Problem-solving, Rapport Building, Dance movement psychotherapisteality Testing, Socialization and Support  Summary of Progress/Problems: The topic for group therapy was feelings about diagnosis. Pt actively participated in group discussion on their past and current diagnosis and how they feel towards this. Pt also identified how society and family members judge them, based on their diagnosis as well as stereotypes and stigmas. Pt shared his diagnoses of manic depression and PTSD which led to his being in denial and ultimately rejecting effective treatment that could have helped him with substance abuse, as well as with mental health issues.  Pt shared he has felt judged by others in the past, but that he agreed with his diagnosis given to him by professionals.  Pt reported he believes he has come to accept the pt's diagnoses as treatable. Pt shared he hopes his diagnosis will help his once he is discharged to allow treatment that will help him to be more normal and to be able to live in his home setting with his significant other.  CSW actively validated the pt's opinion and provided feedback.  Pt was polite and cooperative with the CSW and other group members and focused and attentive to the topics discussed and the sharing of others.   CSW actively validated the pt's opinion and provided feedback.  Pt was polite and cooperative with the CSW and other group members and focused and attentive to the topics discussed and the sharing of others.    Dorothe PeaJonathan F. Heru Montz, LCSWA, LCAS

## 2015-10-16 NOTE — Plan of Care (Signed)
Problem: [facility] Participation in Recreation Therapeutic Interventions Goal: STG-Patient will demonstrate improved self esteem by identif STG: Self-Esteem - Within 4 treatment sessions, patient will verbalize at least 5 positive affirmation statements in each of 2 treatment sessions to increase self-esteem post d/c.  Outcome: Progressing Treatment Session 1; Completed 1 out of 2: At approximately 3:45 pm, LRT met with patient in craft room. Patient verbalized 5 positive affirmation statements. Patient reported it felt "real good". LRT encouraged patient to continue saying positive affirmation statements. Intervention Used; I Am statements  Leonette Monarch, LRT/CTRS 03.28.17 4:55 pm Goal: STG-Patient will identify at least five coping skills for ** STG: Coping Skills - Within 4 treatment sessions, patient will verbalize at least 5 coping skills for substance abuse in each of 2 treatment sessions to decrease substance abuse post d/c.  Outcome: Progressing Treatment Session 1; Completed 1 out of 2: At approximately 3:45 pm, LRT met with patient in craft room. Patient verbalized 5 coping skills for substance abuse. LRT educated patient on leisure and why it is important to implement it into his schedule. LRT educated and provided patient on blank schedule to help him plan his day and try to avoid using substances. LRT educated patient on healthy support systems. Intervention Used: Coping Skills worksheet  Leonette Monarch, LRT/CTRS 03.28.17 5:01 pm

## 2015-10-16 NOTE — Progress Notes (Addendum)
Patient with appropriate affect, cooperative behavior with meals, meds and plan of care. No SI/HI at this time. Blood glucose monitored with insulin as ordered. No s/s of detox at this time. Team working on discharge plan. Therapy groups encouraged. Safety maintained. States daily goal is to talk with Child psychotherapistsocial worker and get [facility].

## 2015-10-17 LAB — GLUCOSE, CAPILLARY: Glucose-Capillary: 298 mg/dL — ABNORMAL HIGH (ref 65–99)

## 2015-10-17 MED ORDER — TRAZODONE HCL 100 MG PO TABS: 100.0000 mg | ORAL_TABLET | Freq: Every day | ORAL | Status: AC

## 2015-10-17 MED ORDER — HYDROCHLOROTHIAZIDE 12.5 MG PO CAPS: 12.5000 mg | ORAL_CAPSULE | Freq: Every day | ORAL | Status: AC

## 2015-10-17 MED ORDER — MIRTAZAPINE 30 MG PO TABS: 30.0000 mg | ORAL_TABLET | Freq: Every day | ORAL | Status: AC

## 2015-10-17 MED ORDER — METFORMIN HCL 1000 MG PO TABS: 1000.0000 mg | ORAL_TABLET | Freq: Two times a day (BID) | ORAL | Status: AC

## 2015-10-17 MED ORDER — LISINOPRIL 5 MG PO TABS: 5.0000 mg | ORAL_TABLET | Freq: Every day | ORAL | Status: AC

## 2015-10-17 NOTE — [facility] Suicide Risk Assessment (Signed)
[facility] INPATIENT:  Family/Significant Other Suicide Prevention Education  Suicide Prevention Education:  Patient Refusal for Family/Significant Other Suicide Prevention Education: The patient Alan Byrd has refused to provide written consent for family/significant other to be provided Family/Significant Other Suicide Prevention Education during admission and/or prior to discharge.  Physician notified.  Glennon MacLaws, Carry Weesner P, MSW, LCSW 10/17/2015, 3:12 PM

## 2015-10-17 NOTE — Progress Notes (Signed)
  [facility] :  Will you be returning to the same living situation after discharge:  No. At discharge, do you have transportation home?: Yes,  [facility] Do you have the ability to pay for your medications: Yes,     Release of information consent forms completed and in the chart;  Patient's signature needed at discharge.  Patient to Follow up at: Follow-up Information    Go to [facility].   Why:  Group 8am, Please keep in touch with Vet Case Manager-[phone] ext. [phone] and outreach coordinator [phone]. [phone]  while waiting for bed to become available   Contact information:   [address]  [place], [place] [address] Phone: [phone]       Next level of care provider [facility] Link:no  Safety Planning and Suicide Prevention discussed: Yes,     Have you used any form of tobacco in the last 30 days? (Cigarettes, Smokeless Tobacco, Cigars, and/or Pipes): Yes  Has patient been referred to the Quitline?: Patient refused referral  Patient has been referred for addiction treatment: Yes, [facility] [facility] [place].  Beds are full at this time, but Pt assisted in getting to [facility] where he can begin groups until bed opens.  Glennon MacLaws, Dodge Ator P, MSW, LCSW 10/17/2015, 3:13 PM

## 2015-10-17 NOTE — [facility] Suicide Risk Assessment (Signed)
[facility]   Principal Problem: Severe recurrent major depressive disorder with psychotic features [facility](HCC) Discharge Diagnoses:  Patient Active Problem List   Diagnosis Date Noted  . 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 Tobacco use disorder [F17.200] 10/12/2015  . Homicidal ideation [R45.850] 10/11/2015  . Suicidal ideation [R45.851] 10/11/2015  . Alcohol use disorder, severe, dependence (HCC) [F10.20] 10/11/2015  . Cocaine use disorder, severe, dependence (HCC) [F14.20] 10/11/2015  . PTSD (post-traumatic stress disorder) [F43.10] 10/11/2015  . Hypertension [I10] 10/11/2015  . Diabetes mellitus without complication (HCC) [E11.9] 10/11/2015    Total Time spent with patient: 30 minutes  Musculoskeletal: Strength & Muscle Tone: within normal limits Gait & Station: normal Patient leans: N/A  Psychiatric Specialty Exam: Review of Systems  Psychiatric/Behavioral: Positive for substance abuse.  All other systems reviewed and are negative.   Blood pressure 132/93, pulse 85, temperature 97.8 F (36.6 C), temperature source Oral, resp. rate 20, height 6\' 2"  (1.88 m), weight 104.781 kg (231 lb), SpO2 99 %.Body mass index is 29.65 kg/(m^2).  General Appearance: Casual  Eye Contact::  Good  Speech:  Clear and Coherent409  Volume:  Normal  Mood:  Anxious  Affect:  Appropriate  Thought Process:  Goal Directed  Orientation:  Full (Time, Place, and Person)  Thought Content:  WDL  Suicidal Thoughts:  No  Homicidal Thoughts:  No  Memory:  Immediate;   Fair Recent;   Fair Remote;   Fair  Judgement:  Poor  Insight:  Shallow  Psychomotor Activity:  Normal  Concentration:  Fair  Recall:  [employer]  Fund of Knowledge:Fair  Language: Fair  Akathisia:  No  Handed:  Right  AIMS (if indicated):     Assets:  Communication Skills Desire for Improvement Financial Resources/Insurance Resilience Social Support   Sleep:  Number of Hours: 8  Cognition: WNL  ADL's:  Intact   Mental Status Per Nursing Assessment::   On Admission:     Demographic Factors:  Male, Age 68 or older and Low socioeconomic status  Loss Factors: Financial problems/change in socioeconomic status  Historical Factors: Prior suicide attempts and Impulsivity  Risk Reduction Factors:   Sense of responsibility to family, Positive social support and Positive therapeutic relationship  Continued Clinical Symptoms:  Depression:   Comorbid alcohol abuse/dependence Impulsivity Alcohol/Substance Abuse/Dependencies  Cognitive Features That Contribute To Risk:  None    Suicide Risk:  Minimal: No identifiable suicidal ideation.  Patients presenting with no risk factors but with morbid ruminations; may be classified as minimal risk based on the severity of the depressive symptoms    Plan Of Care/Follow-up recommendations:  Activity:  as tolerated. Diet:  low sodium heart healthy ADA diet. Other:  keep follow-up appointments.  Kristine LineaJolanta Remy Voiles, MD 10/17/2015, 11:15 AM

## 2015-10-17 NOTE — Progress Notes (Signed)
Recreation Therapy Notes  INPATIENT RECREATION TR PLAN  Patient Details Name: Alan Byrd MRN: [id number] DOB: 26-Jul-1945 Today's Date: 10/17/2015  Rec Therapy Plan Is patient appropriate for Therapeutic Recreation?: Yes Treatment times per week: At least once a week TR Treatment/Interventions: 1:1 session, Group participation (Comment) (Appropriate participation in daily recreation therapy tx)  Discharge Criteria Pt will be discharged from therapy if:: Treatment goals are met, Discharged Treatment plan/goals/alternatives discussed and agreed upon by:: Patient/family  Discharge Summary Short term goals set: See Care Plan Short term goals met: Complete Progress toward goals comments: One-to-one attended Which groups?: Goal setting, Other (Comment) (Self-expression) One-to-one attended: Self-esteem, coping skills Reason goals not met: N/A Therapeutic equipment acquired: None Reason patient discharged from therapy: Discharge from hospital Pt/family agrees with progress & goals achieved: Yes Date patient discharged from therapy: 10/17/15   Leonette Monarch, LRT/CTRS 10/17/2015, 12:23 PM

## 2015-10-17 NOTE — Plan of Care (Signed)
Problem: [facility] Participation in Recreation Therapeutic Interventions Goal: STG-Patient will demonstrate improved self esteem by identif STG: Self-Esteem - Within 4 treatment sessions, patient will verbalize at least 5 positive affirmation statements in each of 2 treatment sessions to increase self-esteem post d/c.  Outcome: Completed/Met Date Met:  10/17/15 Treatment Session 2; Completed 2 out of 2; At approximately 11:45 am, LRT met with patient in patient room. Patient verbalized 5 positive affirmation statements. Patient reported it felt "real good". LRT encouraged patient to continue saying positive affirmation statements.  Intervention Used: I Am statements  Leonette Monarch, LRT/CTRS 03.29.17 12:16 pm Goal: STG-Patient will identify at least five coping skills for ** STG: Coping Skills - Within 4 treatment sessions, patient will verbalize at least 5 coping skills for substance abuse in each of 2 treatment sessions to decrease substance abuse post d/c.  Outcome: Completed/Met Date Met:  10/17/15 Treatment Session 2; Completed 2 out of 2: At approximately 11:45 am, LRT met with patient in patient room. Patient verbalized 5 coping skills for substance abuse. LRT encouraged patient to participate in leisure activities. Intervention Used: Coping Skills worksheet  Leonette Monarch, LRT/CTRS 03.29.17 12:22 pm

## 2015-10-17 NOTE — Progress Notes (Signed)
D/C instructions/transition record/suicide risk assessmenr/meds/follow-up appointments reviewed, pt verbalized understanding, pt's belongings returned to pt, denies SI/HI/AVH. 

## 2015-10-17 NOTE — Discharge Summary (Signed)
Physician Discharge Summary Note  Patient:  Alan Byrd is an 70 y.o., male MRN:  [id number] DOB:  1945-12-04 Patient phone:  [phone] (home)  Patient address:   [address] [place] [address],  Total Time spent with patient: 30 minutes  Date of Admission:  10/12/2015 Date of Discharge: 10/17/2015  Reason for Admission:  Suicidal ideation, substance abuse.  Identifying data. Mr. Alan Byrd is a 70 year old male with history of depression, anxiety, and substance use.  Chief complaint. "I need help."  History of present illness. Information was obtained from the patient and the chart. The patient has a long history of cocaine and alcohol use with no period of sobriety to speak of. He has been hospitalized multiple times at the [place] [facility] and participated in substance abuse treatment programs to no avail. He was brought to the hospital by police after application with a friend in his treatment. They have been drinking to get an started arguing about the woman. In the emergency room the patient expressed thoughts of killing himself but also his friend and wanted help with his depression and substance use. He reports poor sleep, decreased appetite, anhedonia, feeling of guilt and hopelessness worthlessness, poor energy and concentration, crying spells and social isolation. He denies feeling suicidal until he had argument with a friend. He did not have a plan. In addition to symptoms of depression he reports symptoms of anxiety of PTSD type. He denies psychotic symptoms. He denies symptoms suggestive of bipolar mania. He has been drinking beer and liquor and using cocaine daily.  Past psychiatric history. Long history of depression, anxiety, and mood instability that is most likely associated with excessive drinking and cocaine use. He has been admitted to [facility] before but does not remember any medications that were prescribed. In any case he is not compliant in the community. He denies ever  attempting suicide.  Family psychiatric history. None reported.  [facility] history. [facility]. He lives with his girlfriend. He would like to be transferred to the [facility].  Principal Problem: Severe recurrent major depressive disorder with psychotic features [facility]) Discharge Diagnoses: Patient Active Problem List   Diagnosis Date Noted  . 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 Tobacco use disorder [F17.200] 10/12/2015  . Homicidal ideation [R45.850] 10/11/2015  . Suicidal ideation [R45.851] 10/11/2015  . Alcohol use disorder, severe, dependence (HCC) [F10.20] 10/11/2015  . Cocaine use disorder, severe, dependence (HCC) [F14.20] 10/11/2015  . PTSD (post-traumatic stress disorder) [F43.10] 10/11/2015  . Hypertension [I10] 10/11/2015  . Diabetes mellitus without complication (HCC) [E11.9] 10/11/2015    Past Psychiatric History: depression, mood instability, substance use.  Past Medical History:  Past Medical History  Diagnosis Date  . Pancreatitis   . Hypertension   . Mood swings (HCC)   . Diabetes mellitus without complication (HCC)    History reviewed. No pertinent past surgical history. Family History: History reviewed. No pertinent family history. Family Psychiatric  History: none reported. Social History:  History  Alcohol Use  . Yes    Comment: st pint vodka and 3-4 40oz beer and whisky daily; no ETOH in 2 days     History  Drug Use No    Social History   Social History  . Marital Status: Single    Spouse Name: N/A  . Number of Children: N/A  . Years of Education: N/A   Social History Main Topics  . Smoking status: Current Every Day  Smoker -- 1.00 packs/day    Types: Cigarettes  . Smokeless tobacco: None  . Alcohol Use: Yes     Comment: st pint vodka and 3-4 40oz beer and whisky daily; no ETOH in 2 days  . Drug Use: No  . Sexual Activity: Not Asked   Other Topics Concern  .  None   Social History Narrative    Hospital Course:    Alan Byrd is a 70 year old male with a history of depression and substance use admitted for homicidal threats in the context of substance use.  1. Suicidal/homicidal ideation. This has resolved.The patient is able to contract for safety. He is forward thinking and optimistic about the future.  2. Mood. He was restarted on Remeron.  3. Alcohol abuse. He completed a brief Librium taper. Vital signs were stable.   4. Substance abuse treatment. [facility] [place] [facility] were not available. [facility] [place].  5. Diabetes. We were advised to switch this patient from metformin to insulin. He however will be going to the homeless shelter.I believe that metformin is a safer medication for him in his current situation. [facility]. He is encouraged to maintain diet, check his blood glucose levels, take medications, and follow up with his provider.  6. HTN. He is on HCTZ and Lisinopril.  7. Insomnia. He is on Trazodone.  8. Discharge. [place] [facility]. [place] [facility].   Physical Findings: AIMS:  , ,  ,  , Dental Status Current problems with teeth and/or dentures?: No Does patient usually wear dentures?: No  CIWA:  CIWA-Ar Total: 1 COWS:  COWS Total Score: 1  Musculoskeletal: Strength & Muscle Tone: within normal limits Gait & Station: normal Patient leans: N/A  Psychiatric Specialty Exam: Review of Systems  Psychiatric/Behavioral: Positive for substance abuse.  All other systems reviewed and are negative.   Blood pressure 132/93, pulse 85, temperature 97.8 F (36.6 C), temperature source Oral, resp. rate 20, height 6\' 2"  (1.88 m), weight 104.781 kg (231 lb), SpO2 99 %.Body mass index is 29.65  kg/(m^2).  See SRA.                                                  Sleep:  Number of Hours: 8   Have you used any form of tobacco in the last 30 days? (Cigarettes, Smokeless Tobacco, Cigars, and/or Pipes): Yes  Has this patient used any form of tobacco in the last 30 days? (Cigarettes, Smokeless Tobacco, Cigars, and/or Pipes) Yes, Yes, A prescription for an FDA-approved tobacco cessation medication was offered at discharge and the patient refused  Blood Alcohol level:  Lab Results  Component Value Date   [facility]    Metabolic Disorder Labs:  No results found for: HGBA1C, MPG No results found for: PROLACTIN No results found for: CHOL, TRIG, HDL, CHOLHDL, VLDL, LDLCALC  See Psychiatric Specialty Exam and Suicide Risk Assessment completed by Attending Physician prior to discharge.  Discharge destination:  Home  Is patient on multiple antipsychotic therapies at discharge:  No   Has Patient had three or more failed trials of antipsychotic  monotherapy by history:  No  Recommended Plan for Multiple Antipsychotic Therapies: NA  Discharge Instructions    Diet - low sodium heart healthy    Complete by:  As directed      Increase activity slowly    Complete by:  As directed             Medication List    STOP taking these medications        oxyCODONE-acetaminophen 5-325 MG tablet  Commonly known as:  PERCOCET/ROXICET      TAKE these medications      Indication   hydrochlorothiazide 12.5 MG capsule  Commonly known as:  MICROZIDE  Take 1 capsule (12.5 mg total) by mouth daily.   Indication:  High Blood Pressure     lisinopril 5 MG tablet  Commonly known as:  PRINIVIL,ZESTRIL  Take 1 tablet (5 mg total) by mouth daily.   Indication:  High Blood Pressure     metFORMIN 1000 MG tablet  Commonly known as:  GLUCOPHAGE  Take 1 tablet (1,000 mg total) by mouth 2 (two) times daily with a meal.   Indication:  Type 2 Diabetes     mirtazapine  30 MG tablet  Commonly known as:  REMERON  Take 1 tablet (30 mg total) by mouth at bedtime.   Indication:  Major Depressive Disorder     traZODone 100 MG tablet  Commonly known as:  DESYREL  Take 1 tablet (100 mg total) by mouth at bedtime.   Indication:  Trouble Sleeping         Follow-up recommendations:  Activity:  s tolerated. Diet:  low sodium heart healthy ADA diet. Other:  keep follow-up appointments.  Comments:    Signed: Kristine Linea, MD 10/17/2015, 11:18 AM

## 2015-10-17 NOTE — Progress Notes (Signed)
D:  Per pt self inventory pt reports sleeping good, appetite fair, energy level low, ability to pay attention poor, rates depression at a 10 out of 10, anxiety at a 10 out of 10, when asked pt denies SI/HI/AVH but he marked on his self inventory that if he is discharged he would hurt himself or someone else, goal today: "To get to a medical place for drugs and talk to Social worker"     A:  Emotional support provided, Encouraged pt to continue with treatment plan and attend all group activities, q15 min checks maintained for safety.  R:  Pt is receptive, going to groups, pleasant and cooperative with staff and other patients on the unit.

## 2017-01-09 IMAGING — CT CT ABD-PELV W/ CM
1 of 3 series · 13 of 32 positions shown, 18 images · IV contrast (omnipaque)
Comparison: Prior radiograph performed earlier on the same day.

CLINICAL DATA: Initial evaluation for acute periumbilical pain for
3 days, emesis. History pancreatitis.

EXAM:
CT ABDOMEN AND PELVIS WITH CONTRAST
TECHNIQUE: Multidetector CT imaging of the abdomen and pelvis was performed
using the standard protocol following bolus administration of
intravenous contrast.
CONTRAST:  25mL OMNIPAQUE IOHEXOL 240 MG/ML SOLN, 125mL OMNIPAQUE
IOHEXOL 300 MG/ML SOLN

[Series 2: routine abd pel with · axial · 0.75mm/px · z∈[-486,-26]mm · 13 of 104 slices shown, 18 images]
[im 6/104  soft-tissue]
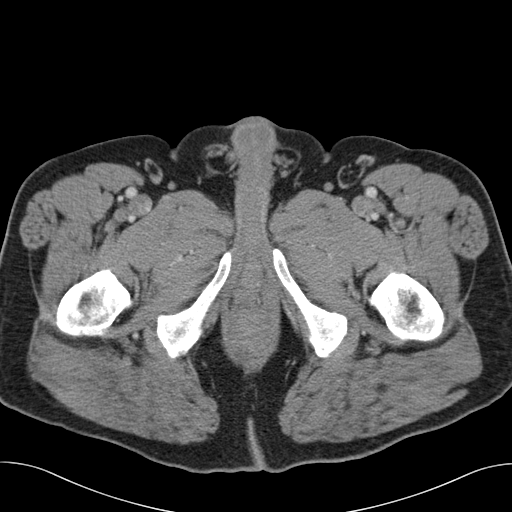
[im 6/104  bone]
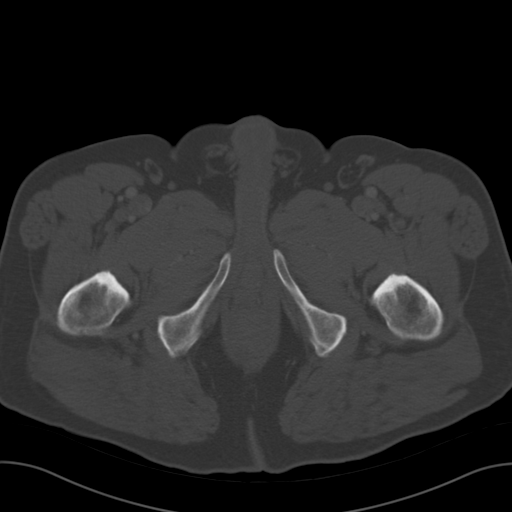
[im 16/104  soft-tissue]
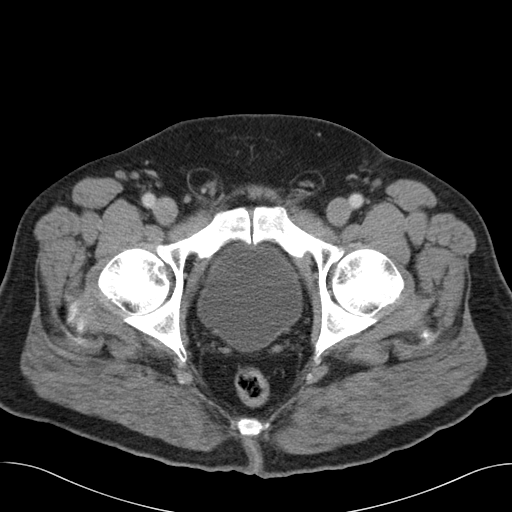
[im 21/104  soft-tissue]
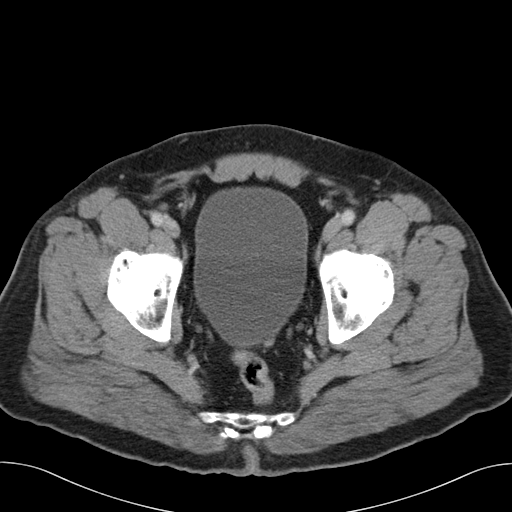
[im 31/104  soft-tissue]
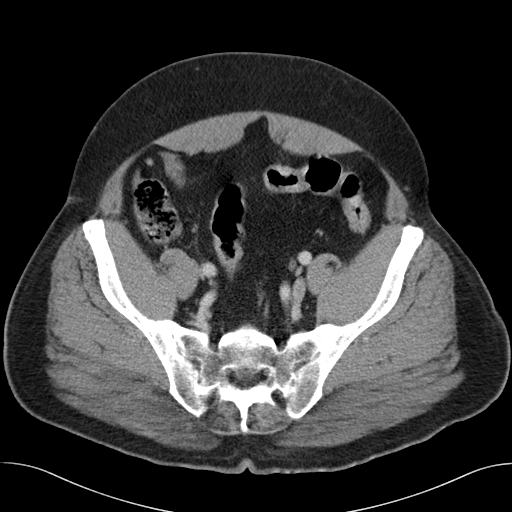
[im 42/104  soft-tissue]
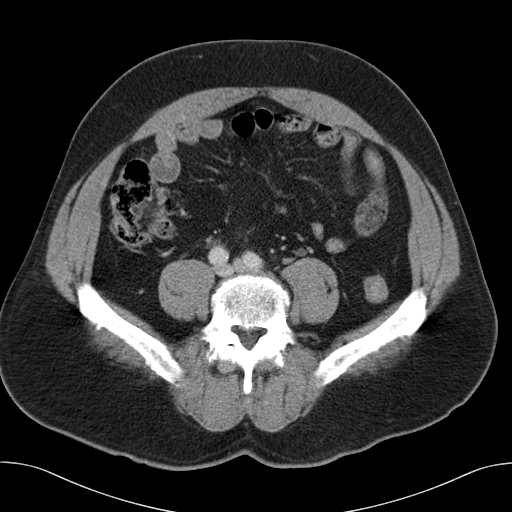
[im 47/104  soft-tissue]
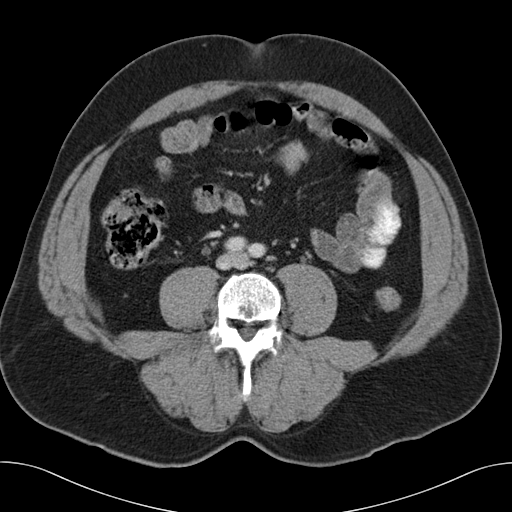
[im 57/104  soft-tissue]
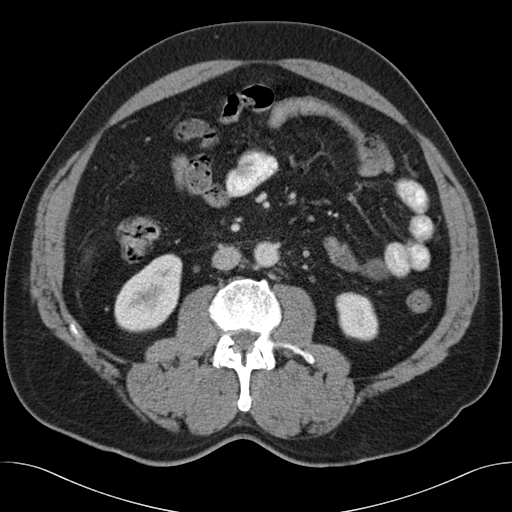
[im 62/104  soft-tissue]
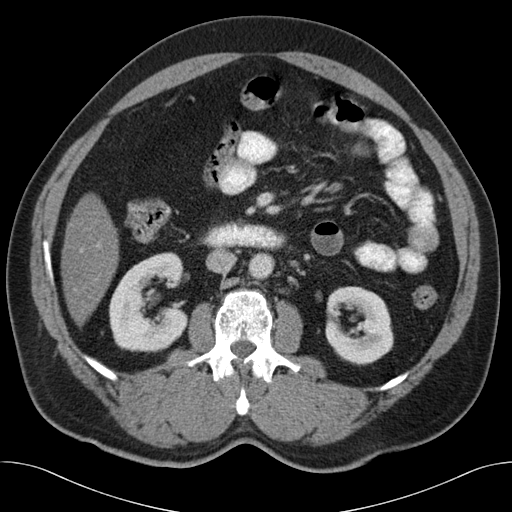
[im 73/104  soft-tissue]
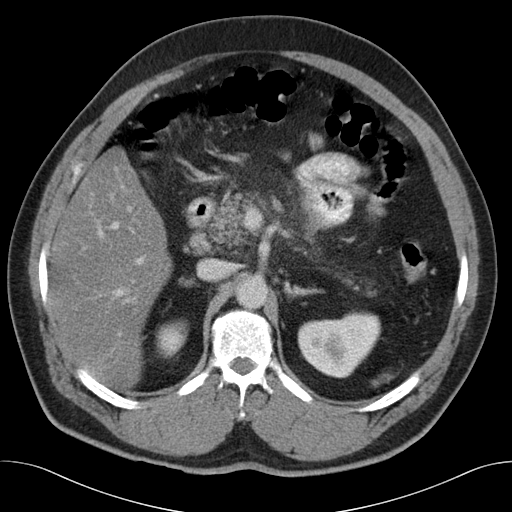
[im 73/104  bone]
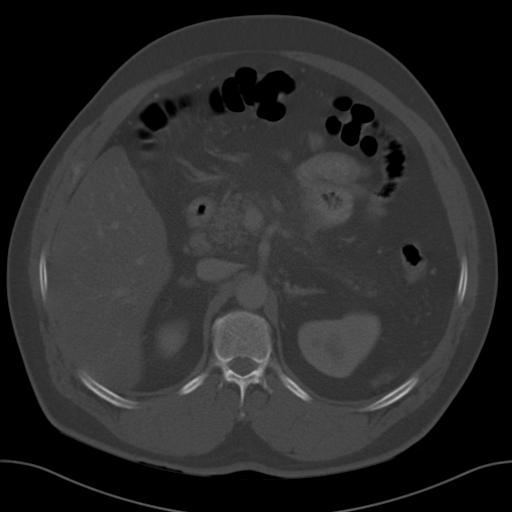
[im 83/104  soft-tissue]
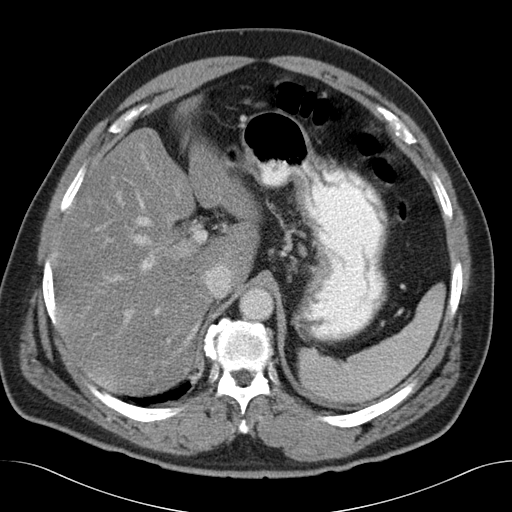
[im 83/104  lung]
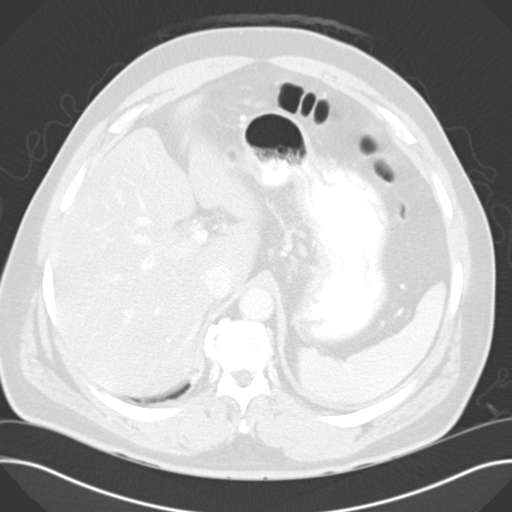
[im 88/104  soft-tissue]
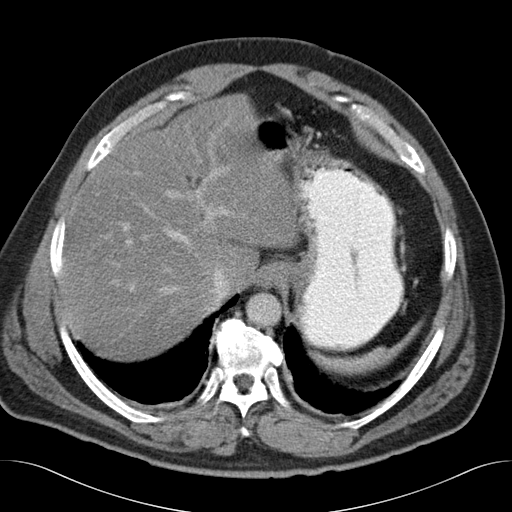
[im 88/104  lung]
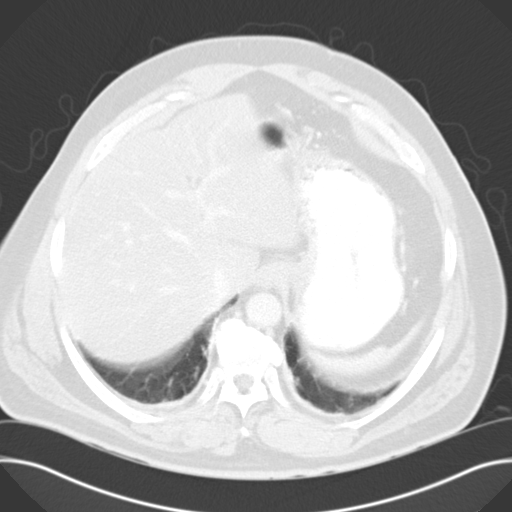
[im 93/104  lung]
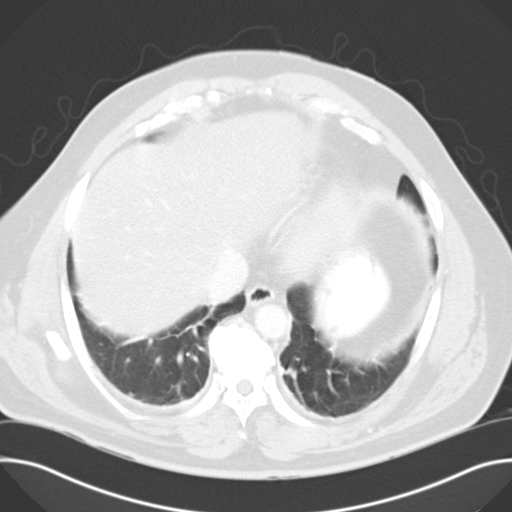
[im 98/104  soft-tissue]
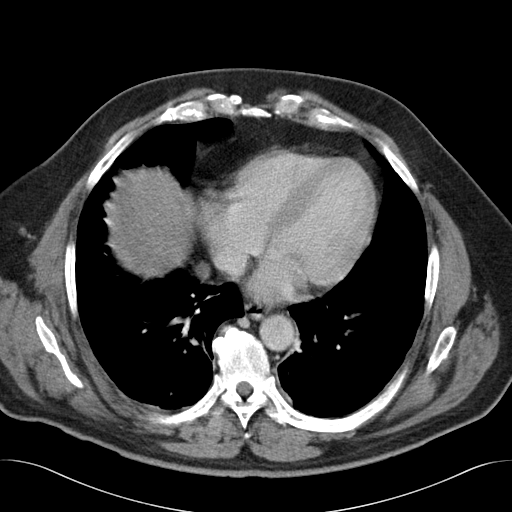
[im 98/104  lung]
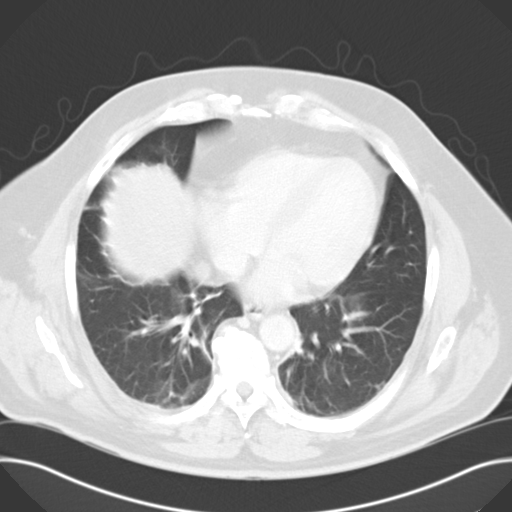

[13 of 32 positions shown; findings below may reference images not displayed]

FINDINGS: Linear bibasilar opacities are most consistent with atelectasis/
scarring. Partially visualized lungs otherwise demonstrate no acute
abnormality.

Diffuse hypoattenuation of the liver is consistent with steatosis.
Liver otherwise unremarkable. Gallbladder within normal limits. No
biliary dilatation. Spleen and adrenal glands demonstrate a normal
appearance.

Hazy inflammatory fat stranding seen about the body of the pancreas,
suggesting acute pancreatitis. No loculated fluid collection. No
evidence for pancreatic necrosis. Normal enhancement seen within the
splenic vein, SMV, and portal vein. Peripancreatic lymph nodes
measuring up to 2.1 cm present, likely reactive.

Kidneys are equal in size with symmetric enhancement. No
nephrolithiasis, hydronephrosis, or focal enhancing renal mass.
Subcentimeter hypodensities within the bilateral kidneys too small
the characterize by CT, but statistically likely reflects small
cyst.

Stomach within normal limits. No evidence for bowel obstruction. No
abnormal wall thickening, mucosal enhancement, or inflammatory fat
stranding seen about the bowels. Appendix is normal. Few scattered
colonic diverticula noted.

Bladder is normal.  Prostate unremarkable.

Small fat containing bilateral inguinal hernias noted. No free air.
No free fluid. No other adenopathy. Normal intravascular enhancement
seen throughout the intra-abdominal aorta and its branch vessels.
Mild atheromatous disease about the aortic bifurcation and proximal
iliac vessels.

No acute osseous abnormality. No worrisome lytic or blastic osseous
lesion.
IMPRESSION: 1. Hazy inflammatory stranding about the pancreas, suggesting acute
pancreatitis. No evidence for complication.
2. Peripancreatic adenopathy measuring up to 2.1 cm, likely
reactive.
3. Mild colonic diverticulosis.
4. Hepatic steatosis.
